# Patient Record
Sex: Female | Born: 1975 | Race: White | Hispanic: No | Marital: Married | Smoking: Never smoker
Health system: Southern US, Community
[De-identification: ages and names within clinical notes are randomized; demographics above are authoritative.]

## PROBLEM LIST (undated history)

## (undated) DIAGNOSIS — G43909 Migraine, unspecified, not intractable, without status migrainosus: Secondary | ICD-10-CM

## (undated) HISTORY — PX: HIP ARTHROSCOPY: SHX668

## (undated) HISTORY — DX: Migraine, unspecified, not intractable, without status migrainosus: G43.909

---

## 2006-08-10 ENCOUNTER — Other Ambulatory Visit: Admission: RE | Admit: 2006-08-10 | Discharge: 2006-08-10 | Payer: Self-pay | Admitting: Obstetrics and Gynecology

## 2007-03-15 ENCOUNTER — Encounter (INDEPENDENT_AMBULATORY_CARE_PROVIDER_SITE_OTHER): Payer: Self-pay | Admitting: Family Medicine

## 2007-03-15 ENCOUNTER — Ambulatory Visit: Payer: Self-pay | Admitting: Family Medicine

## 2007-03-15 LAB — CONVERTED CEMR LAB
Glucose, Bld: 79 mg/dL (ref 70–99)
Progesterone: 13.2 ng/mL
Prolactin: 9 ng/mL
TSH: 3.339 microintl units/mL (ref 0.350–5.50)

## 2007-03-16 ENCOUNTER — Encounter (INDEPENDENT_AMBULATORY_CARE_PROVIDER_SITE_OTHER): Payer: Self-pay | Admitting: Family Medicine

## 2007-09-12 ENCOUNTER — Telehealth: Payer: Self-pay | Admitting: *Deleted

## 2007-10-09 ENCOUNTER — Ambulatory Visit (HOSPITAL_COMMUNITY): Admission: RE | Admit: 2007-10-09 | Discharge: 2007-10-09 | Payer: Self-pay | Admitting: Obstetrics and Gynecology

## 2007-11-22 ENCOUNTER — Inpatient Hospital Stay (HOSPITAL_COMMUNITY): Admission: RE | Admit: 2007-11-22 | Discharge: 2007-11-24 | Payer: Self-pay | Admitting: Obstetrics and Gynecology

## 2008-08-15 ENCOUNTER — Encounter (INDEPENDENT_AMBULATORY_CARE_PROVIDER_SITE_OTHER): Payer: Self-pay | Admitting: Family Medicine

## 2008-08-15 LAB — CONVERTED CEMR LAB: Blood Glucose, AC Bkfst: 80 mg/dL

## 2008-10-15 ENCOUNTER — Encounter (INDEPENDENT_AMBULATORY_CARE_PROVIDER_SITE_OTHER): Payer: Self-pay | Admitting: Family Medicine

## 2008-10-15 LAB — CONVERTED CEMR LAB: Vit D, 1,25-Dihydroxy: 30 (ref 30–89)

## 2009-03-19 ENCOUNTER — Ambulatory Visit (HOSPITAL_COMMUNITY): Admission: RE | Admit: 2009-03-19 | Discharge: 2009-03-19 | Payer: Self-pay | Admitting: Obstetrics and Gynecology

## 2009-04-08 ENCOUNTER — Encounter: Payer: Self-pay | Admitting: *Deleted

## 2009-04-08 DIAGNOSIS — J020 Streptococcal pharyngitis: Secondary | ICD-10-CM

## 2009-05-14 ENCOUNTER — Observation Stay (HOSPITAL_COMMUNITY): Admission: RE | Admit: 2009-05-14 | Discharge: 2009-05-14 | Payer: Self-pay | Admitting: Obstetrics and Gynecology

## 2009-05-27 ENCOUNTER — Inpatient Hospital Stay (HOSPITAL_COMMUNITY): Admission: AD | Admit: 2009-05-27 | Discharge: 2009-05-29 | Payer: Self-pay | Admitting: Obstetrics and Gynecology

## 2010-04-09 ENCOUNTER — Encounter: Admission: RE | Admit: 2010-04-09 | Discharge: 2010-04-09 | Payer: Self-pay | Admitting: Obstetrics and Gynecology

## 2010-10-13 ENCOUNTER — Encounter
Admission: RE | Admit: 2010-10-13 | Discharge: 2010-10-13 | Payer: Self-pay | Source: Home / Self Care | Attending: Family Medicine | Admitting: Family Medicine

## 2010-11-15 ENCOUNTER — Encounter: Payer: Self-pay | Admitting: Obstetrics and Gynecology

## 2010-12-06 ENCOUNTER — Inpatient Hospital Stay (HOSPITAL_COMMUNITY)

## 2010-12-06 ENCOUNTER — Inpatient Hospital Stay (HOSPITAL_COMMUNITY)
Admission: AD | Admit: 2010-12-06 | Discharge: 2010-12-15 | DRG: 781 | Disposition: A | Discharge status: Home / Self Care | Source: Ambulatory Visit | Attending: Obstetrics and Gynecology | Admitting: Obstetrics and Gynecology

## 2010-12-06 DIAGNOSIS — O99891 Other specified diseases and conditions complicating pregnancy: Principal | ICD-10-CM | POA: Diagnosis present

## 2010-12-06 DIAGNOSIS — O212 Late vomiting of pregnancy: Secondary | ICD-10-CM | POA: Diagnosis present

## 2010-12-06 DIAGNOSIS — A0472 Enterocolitis due to Clostridium difficile, not specified as recurrent: Secondary | ICD-10-CM | POA: Diagnosis present

## 2010-12-06 DIAGNOSIS — R1031 Right lower quadrant pain: Secondary | ICD-10-CM | POA: Diagnosis present

## 2010-12-06 LAB — CBC
HCT: 31.4 % — ABNORMAL LOW (ref 36.0–46.0)
Hemoglobin: 10.6 g/dL — ABNORMAL LOW (ref 12.0–15.0)
MCV: 97.2 fL (ref 78.0–100.0)
RDW: 13.4 % (ref 11.5–15.5)
WBC: 8.3 10*3/uL (ref 4.0–10.5)

## 2010-12-06 LAB — COMPREHENSIVE METABOLIC PANEL
ALT: 15 U/L (ref 0–35)
Alkaline Phosphatase: 94 U/L (ref 39–117)
BUN: 4 mg/dL — ABNORMAL LOW (ref 6–23)
CO2: 22 mEq/L (ref 19–32)
GFR calc non Af Amer: >60 mL/min (ref >60)
Glucose, Bld: 99 mg/dL (ref 70–99)
Potassium: 3.5 mEq/L (ref 3.5–5.1)
Sodium: 131 mEq/L — ABNORMAL LOW (ref 135–145)
Total Bilirubin: 0.5 mg/dL (ref 0.3–1.2)
Total Protein: 5.5 g/dL — ABNORMAL LOW (ref 6.0–8.3)

## 2010-12-06 LAB — URINALYSIS, ROUTINE W REFLEX MICROSCOPIC
Bilirubin Urine: NEGATIVE
Ketones, ur: NEGATIVE mg/dL
Nitrite: NEGATIVE
Protein, ur: NEGATIVE mg/dL

## 2010-12-06 LAB — AMYLASE: Amylase: 50 U/L (ref 0–105)

## 2010-12-06 LAB — DIFFERENTIAL
Basophils Absolute: 0 10*3/uL (ref 0.0–0.1)
Eosinophils Relative: 1 % (ref 0–5)
Lymphocytes Relative: 14 % (ref 12–46)
Lymphs Abs: 1.1 10*3/uL (ref 0.7–4.0)
Monocytes Absolute: 0.6 10*3/uL (ref 0.1–1.0)
Neutro Abs: 6.6 10*3/uL (ref 1.7–7.7)

## 2010-12-06 LAB — URINE MICROSCOPIC-ADD ON

## 2010-12-06 LAB — FETAL FIBRONECTIN: Fetal Fibronectin: NEGATIVE

## 2010-12-06 MED ORDER — IOHEXOL 300 MG/ML  SOLN
100.0000 mL | Freq: Once | INTRAMUSCULAR | Status: AC | PRN
  Administered 2010-12-06: 100 mL via INTRAVENOUS

## 2010-12-07 LAB — CBC
MCH: 32.8 pg (ref 26.0–34.0)
MCHC: 33.3 g/dL (ref 30.0–36.0)
MCV: 98.3 fL (ref 78.0–100.0)
Platelets: 168 10*3/uL (ref 150–400)
RDW: 13.6 % (ref 11.5–15.5)

## 2010-12-08 LAB — CBC
HCT: 27.9 % — ABNORMAL LOW (ref 36.0–46.0)
MCHC: 33 g/dL (ref 30.0–36.0)
MCV: 99.3 fL (ref 78.0–100.0)
RDW: 13.6 % (ref 11.5–15.5)

## 2010-12-08 LAB — URINE CULTURE: Colony Count: NO GROWTH

## 2010-12-09 LAB — CBC
HCT: 26.9 % — ABNORMAL LOW (ref 36.0–46.0)
MCH: 33.2 pg (ref 26.0–34.0)
MCV: 99.3 fL (ref 78.0–100.0)
Platelets: 174 10*3/uL (ref 150–400)
RBC: 2.71 MIL/uL — ABNORMAL LOW (ref 3.87–5.11)
RDW: 13.6 % (ref 11.5–15.5)

## 2010-12-10 NOTE — Consult Note (Addendum)
Alicia [name], Alicia [name]              ACCOUNT NO.:  [id number]  MEDICAL RECORD NO.:  [id number]           PATIENT TYPE:  I  LOCATION:  9151                          [facility]ACILITY:  [facility]  PHYSICIAN:  Lennie Muckle, MD      DATE OF BIRTH:  Jul 06, 1976  DATE OF CONSULTATION:  12/06/2010 DATE OF DISCHARGE:                                CONSULTATION   CHIEF COMPLAINT:  Abdominal pain.  REFERRING PHYSICIAN:  Sherron Monday, MD  REASON FOR CONSULTATION:  Rule out appendicitis.  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  She states that she had diarrhea on Wednesday.  She has been around several sick people, doing her job as a Development worker, community.  She has crampy abdominal pain.  The diarrhea had resolved by Thursday.  However, she began having abdominal pain today which was located in the epigastric region.  She states that it then seemed to be more localized towards the right lower quadrant.  She also had noticed some contractions.  She has not had any fevers or chills.  She describes no nausea or vomiting.  She has some mild nausea but no emesis.  She has had a CBC drawn with a white count of 8.3.  Her serum chemistries were all within normal limits.  She received an ultrasound in the emergency department at the [facility] which was negative.  She did have one for imaging which revealed thickening around the cecum, ascending and a splenic flexure.  There was concern for questionable appendicitis on examination.  They could not definitely see the appendix on imaging.  PAST MEDICAL HISTORY:  Carcinoma in situ.  She has had a vasovagal syndrome.  SURGICAL HISTORY:  Excision of a melanoma.  MEDICATIONS:  Prenatal vitamin.  No drug allergies.  SOCIAL HISTORY:  She is married.  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 No tobacco or alcohol use.  FAMILY HISTORY:  Diabetes, hypertension, and strokes.  REVIEW OF SYSTEMS:  As per the  HPI.  PHYSICAL EXAMINATION:  GENERAL:  Pleasant obviously pregnant female, lying in stretcher, in no acute distress. VITAL SIGNS:  Temperature is 98.1, blood pressure is 110/61, pulse is 70. ABDOMEN:  Focus examination of the abdomen, gravid abdomen.  She is soft to palpation.  There is some tenderness with palpation on the right side of the abdomen.  No frank peritoneal signs.  No rebound tenderness.  The uterus is palpable.  CT scan image reviewed with Radiology.  I do see a thickening of the hepatic flexure as extending in to the cecum as well as the splenic flexure and descending colon.  I no really see any significant amount of fluid.  ASSESSMENT AND PLAN:  Enterocolitis with likely viral in etiology.  We talked with Dr. Ellyn Hack about the findings of the CT scan.  Given the fact that she has had some contractions and there is a concern due to the inflammation, I agree with IV antibiotics.  We will place her on Zosyn, likely send her home on Augmentin.  She will be admitted for 24- hour  observation with repeating examination.  I do not think any further imaging or intervention is required.  Definitely there is no need for surgery as there are no findings consistent with acute appendicitis.     Lennie Muckle, MD     ALA/MEDQ  D:  12/06/2010  T:  12/07/2010  Job:  [id number]  Electronically Signed by Bertram Savin MD on 12/10/2010 02:20:02 PM

## 2010-12-11 LAB — CBC
HCT: 28.7 % — ABNORMAL LOW (ref 36.0–46.0)
MCH: 33 pg (ref 26.0–34.0)
MCHC: 33.8 g/dL (ref 30.0–36.0)
RDW: 13.1 % (ref 11.5–15.5)

## 2010-12-11 LAB — COMPREHENSIVE METABOLIC PANEL
AST: 17 U/L (ref 0–37)
Albumin: 1.6 g/dL — ABNORMAL LOW (ref 3.5–5.2)
Alkaline Phosphatase: 98 U/L (ref 39–117)
Glucose, Bld: 75 mg/dL (ref 70–99)
Sodium: 136 mEq/L (ref 135–145)
Total Bilirubin: 0.4 mg/dL (ref 0.3–1.2)

## 2010-12-16 DIAGNOSIS — A0472 Enterocolitis due to Clostridium difficile, not specified as recurrent: Secondary | ICD-10-CM

## 2010-12-22 NOTE — Discharge Summary (Signed)
  NAMEVERMELL, [name]              ACCOUNT NO.:  [id number]  MEDICAL RECORD NO.:  [id number]           PATIENT TYPE:  I  LOCATION:  9151                          [facility]:  [facility]  PHYSICIAN:  Huel Cote, M.D. DATE OF BIRTH:  [birth year]  DATE OF ADMISSION:  12/06/2010 DATE OF DISCHARGE:  12/15/2010                              DISCHARGE SUMMARY   DISCHARGE DIAGNOSES: 1. 33 and 6/7th weeks gestation. 2. Clostridium difficile colitis.  DISCHARGE MEDICATIONS: 1. Flagyl 500 mg p.o. t.i.d. for 10 days. 2. Vancomycin 125 mg p.o. q.i.d. for 10 days. 3. Phenergan 25 mg p.o. every 6 hours p.r.n.  DISCHARGE FOLLOWUP:  The patient is to follow up in the office in 1 week.  HOSPITAL COURSE:  The patient is a 35 year old G3, P2-0-0-2 who came in at 40 plus weeks' gestation with nausea, vomiting, diarrhea, and significant abdominal pain, which was concerning for peritoneal signs. She was evaluated initially by General Surgery for ruling out appendicitis and had a CT scan, which revealed a colitis-type picture. She then had stool cultures performed on the second day of admission, which confirmed C. diff and infection.  She was placed on Flagyl 500 mg p.o. three times daily and made very slow improvement.  She also received vancomycin enemas x2 per Surgery's recommendation; however, despite this really did not improve as expected.  Infectious Disease was consulted and Dr. Maurice March suggested adding vancomycin 125 mg p.o. q.i.d., this was done on December 11, 2010.  Over the next several days, the patient continued to make gradual improvement.  She finally after approximately 4 days of vancomycin was able to tolerate p.o. intake well.  Her pain was significantly improved and she had a minimal amount of stools with only approximately 2 loose stools a day.  At this point, it was discussed with Dr. Maurice March who felt that she could safely be managed as an outpatient and she was discharged to home to  continue therapy for an additional 10 days on both her p.o. vancomycin and p.o. Flagyl.  She was given a prescription for Phenergan p.r.n. and was advised to follow up in the office in 1 week for continuation of her routine obstetrical care.     Huel Cote, M.D.     KR/MEDQ  D:  12/15/2010  T:  12/15/2010  Job:  [id number]  Electronically Signed by Huel Cote M.D. on 12/22/2010 08:47:14 AM

## 2010-12-23 ENCOUNTER — Other Ambulatory Visit (HOSPITAL_COMMUNITY): Payer: Self-pay | Admitting: Obstetrics and Gynecology

## 2010-12-29 ENCOUNTER — Ambulatory Visit (HOSPITAL_COMMUNITY)
Admission: RE | Admit: 2010-12-29 | Discharge: 2010-12-29 | Disposition: A | Discharge status: Home / Self Care | Source: Ambulatory Visit | Attending: Obstetrics and Gynecology | Admitting: Obstetrics and Gynecology

## 2010-12-29 DIAGNOSIS — Z3689 Encounter for other specified antenatal screening: Secondary | ICD-10-CM | POA: Insufficient documentation

## 2010-12-29 DIAGNOSIS — O365990 Maternal care for other known or suspected poor fetal growth, unspecified trimester, not applicable or unspecified: Secondary | ICD-10-CM | POA: Insufficient documentation

## 2011-01-20 ENCOUNTER — Inpatient Hospital Stay (HOSPITAL_COMMUNITY)
Admission: AD | Admit: 2011-01-20 | Discharge: 2011-01-22 | DRG: 775 | Disposition: A | Discharge status: Home / Self Care | Source: Ambulatory Visit | Attending: Obstetrics and Gynecology | Admitting: Obstetrics and Gynecology

## 2011-01-20 LAB — CBC
MCH: 32.2 pg (ref 26.0–34.0)
MCHC: 33.3 g/dL (ref 30.0–36.0)
Platelets: 197 10*3/uL (ref 150–400)

## 2011-01-21 LAB — CBC
MCHC: 32.3 g/dL (ref 30.0–36.0)
Platelets: 168 10*3/uL (ref 150–400)
RDW: 13 % (ref 11.5–15.5)

## 2011-01-21 LAB — RPR: RPR Ser Ql: NONREACTIVE

## 2011-01-26 NOTE — Discharge Summary (Signed)
Alicia [name], FINES              ACCOUNT NO.:  [id number]  MEDICAL RECORD NO.:  [id number]           PATIENT TYPE:  I  LOCATION:  9148                          [facility]  PHYSICIAN:  Huel Cote, M.D. DATE OF BIRTH:  1976-10-02  DATE OF ADMISSION:  01/20/2011 DATE OF DISCHARGE:  01/22/2011                              DISCHARGE SUMMARY   DISCHARGE DIAGNOSES: 1. Term pregnancy at 38-5/7 weeks, delivered. 2. Status post normal spontaneous vaginal delivery.  DISCHARGE MEDICATIONS:  Motrin 600 mg p.o. every 6 hours.  DISCHARGE FOLLOWUP:  The patient is to follow up in the office in 6 weeks for her full postpartum exam.  HOSPITAL COURSE:  The patient is a 35 year old G3, P 2-0-0-2, who was admitted at 38-5/7 weeks' gestation in active labor with contractions every 2-3 minutes.  Cervix was 5+ centimeters on admission.  She was brought to Labor and Delivery and received her epidural.  Prenatal care had been complicated by a course of C. diff colitis requiring a 9-day hospital stay and IV vancomycin and Flagyl.  She also had size less than dates, but ultrasound showed normal growth.  PRENATAL LABS:  Are as follows:  O positive.  Antibody negative. Rubella immune.  Hepatitis B surface antigen negative.  HIV negative. RPR nonreactive.  GC negative.  Chlamydia negative.  CF negative.  First trimester screen negative.  Alpha-fetoprotein negative.  One-hour Glucola 130.  Group B strep negative.  PAST OBSTETRICAL HISTORY:  In 2009, she had a vaginal delivery of a 5- pound 6-ounce infant.  In 2010, she had a vaginal delivery of a 6-pound 5-ounce infant.  PAST GYN HISTORY:  ASCUS with HPV positive Pap.  Occult blood was negative.  She is to repeat at postpartum.  PAST SURGICAL HISTORY:  Melanoma excision.  PAST MEDICAL HISTORY:  Melanoma, C. diff colitis, and vasovagal symptoms.  ALLERGIES:  SHE HAD NO KNOWN DRUG ALLERGIES.  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  She has no tobacco, alcohol or drug use.   On admission, blood pressure was normal at 128/76.  Fetal heart rate reactive.  After receiving her epidural, cervix was found to be 9 cm dilated.  She had rupture of membranes with clear fluid noted and progressed rapidly and reached complete dilation.  She pushed great with normal spontaneous vaginal delivery of viable female infant over a small first-degree laceration.  Apgars were 9 and 9, weight was 6 pounds 8 ounces.  Placenta delivery was spontaneous.  Estimated blood loss was 350 mL.  She had a first-degree laceration repaired with 3-0 Vicryl Rapide for hemostasis.  Small periurethral inclusion cyst was drained of milky fluid measuring approximately 1.5 cm.  She was admitted for routine postpartum care.  On postpartum day #1, hemoglobin was stable at 9.4.  On postpartum day #2, she was felt stable for discharge home with given instruction on pelvic rest and will follow up in the office in 6 weeks for her full postpartum exam.     Huel Cote, M.D.     KR/MEDQ  D:  01/22/2011  T:  01/22/2011  Job:  [id number]  Electronically Signed by Huel Cote M.D. on 01/26/2011 09:29:58 AM

## 2011-01-29 ENCOUNTER — Inpatient Hospital Stay (HOSPITAL_COMMUNITY): Admission: AD | Admit: 2011-01-29 | Payer: Self-pay | Source: Home / Self Care | Admitting: Obstetrics and Gynecology

## 2011-01-30 LAB — CBC
HCT: 28.2 % — ABNORMAL LOW (ref 36.0–46.0)
Hemoglobin: 9.8 g/dL — ABNORMAL LOW (ref 12.0–15.0)
Platelets: 115 10*3/uL — ABNORMAL LOW (ref 150–400)
RBC: 2.84 MIL/uL — ABNORMAL LOW (ref 3.87–5.11)
RBC: 3.52 MIL/uL — ABNORMAL LOW (ref 3.87–5.11)
WBC: 6.4 10*3/uL (ref 4.0–10.5)
WBC: 8 10*3/uL (ref 4.0–10.5)

## 2011-01-30 LAB — RPR: RPR Ser Ql: NONREACTIVE

## 2011-03-08 ENCOUNTER — Other Ambulatory Visit: Payer: Self-pay | Admitting: Family Medicine

## 2011-03-08 ENCOUNTER — Other Ambulatory Visit: Payer: Self-pay | Admitting: Obstetrics and Gynecology

## 2011-03-09 NOTE — Discharge Summary (Signed)
NAMESHAINNA, FAUX              ACCOUNT NO.:  [id number]   MEDICAL RECORD NO.:  [id number]          PATIENT TYPE:  INP   LOCATION:  9114                          [facility]  [facility]   PHYSICIAN:  Sherron Monday, MD        DATE OF BIRTH:  [birth year]   DATE OF ADMISSION:  05/27/2009  DATE OF DISCHARGE:  05/29/2009                               DISCHARGE SUMMARY   ADMITTING DIAGNOSIS:  Intrauterine pregnancy at term in active labor.   DISCHARGE DIAGNOSIS:  Intrauterine pregnancy at term in active labor,  delivered via spontaneous vaginal delivery.   HISTORY OF PRESENT ILLNESS:  A 35 year old, G2, P1-0-0-1 at 39 plus 4 in  active labor.  She states that she has had good fetal movement, no loss  of fluid, no vaginal bleeding, contractions increasing in intensity  since approximately 5 p.m.  The pregnancy was complicated by breech  presentation, version was performed at approximately 37 weeks, that was  successful, otherwise uncomplicated care.   PAST MEDICAL HISTORY:  Significant for vasovagal syndrome as well as a  recent diagnosis of a in situ melanoma.   PAST SURGICAL HISTORY:  Significant for skin incision, excision of the  melanoma.   PAST OB/GYN HISTORY:  Concepcion Living was in January 2009, vaginal delivery of a female  infant.  The pregnancy was complicated by IUGR of the infant.  G2 is the  present pregnancy.  Initially, it was a twin pregnancy, but a vanishing  twin.  The patient states she has had abnormal Pap smear and had a  colposcopy with a repeat normal.  No history of any infection or  sexually transmitted diseases.   MEDICATIONS:  Prenatal vitamins.   ALLERGIES:  No known drug allergies.   SOCIAL HISTORY:  Denies alcohol, tobacco, or drug use.  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   FAMILY HISTORY:  Diabetes in maternal grandparents, hypertension in  father; passed away from a stroke.   PRENATAL LABS:  Hemoglobin 12.7,  platelets 147,000, O positive, antibody  screen negative.  Gonorrhea negative, chlamydia negative, RPR  nonreactive, rubella immune, cystic fibrosis screen negative, hepatitis  B surface antigen negative, HIV negative, first trimester screen within  normal limits, AFP within normal limits, Glucola 74, and group B strep  was negative.  Ultrasound performed at 19 weeks showed normal anatomy  except limited heart anterior/right placenta in the female infant at 57-  week scan completed, normal anatomy at 35 weeks' scan revealed normal  AFI growth of 33% on breech presentation.  Following this, a reversion  was performed.   On admission physical exam, afebrile.  Vital signs stable with benign  exam.  Fetal heart tones in the 130s with contractions every 2-3  minutes, 7-8 cm dilated, 9% effaced, and 0 station.  She received an  epidural and AROM was performed for clear fluid.  She rapidly progressed  to complete and she pushed for approximately 5 minutes to delivery of a  viable female infant at 12:02 a.m. with Apgars of 8 at 1 minute  and 9 at  5 minutes, weight of 6 pounds 5 ounces.  Placenta was expressed intact  at 07, a second-degree perineal laceration repaired with 3-0 Vicryl  Rapide in the typical fashion.  EBL was less than 500 mL.  Her  postpartum course was relatively uncomplicated.  She remained afebrile  with vital signs stable.  Normal lochia and pain was well controlled.  She was ambulating and voiding without difficulty.  She was discharged  to home on postpartum day #1 at her request.  She was discharged home  with prescriptions for Motrin and prenatal vitamins.  She will follow up  in approximately 6 weeks.  She is O positive, rubella immune.  She plans  to breast feed.  Her hemoglobin decreased from 12.0-9.8 and she declines  contraception at this time.      Sherron Monday, MD  Electronically Signed     JB/MEDQ  D:  05/29/2009  T:  05/29/2009  Job:  [id number]

## 2011-03-12 NOTE — Discharge Summary (Signed)
Alicia [name], SLUTSKY              ACCOUNT NO.:  [id number]   MEDICAL RECORD NO.:  [id number]          PATIENT TYPE:  INP   LOCATION:  9114                          [facility]   PHYSICIAN:  Huel Cote, M.D. DATE OF BIRTH:  27-May-1976   DATE OF ADMISSION:  11/22/2007  DATE OF DISCHARGE:  11/24/2007                               DISCHARGE SUMMARY   DISCHARGE DIAGNOSES:  1. Term pregnancy at 30+ weeks delivered  2. Intrauterine growth retardation with estimated fetal weight less      than 5th percentile.   DISCHARGE MEDICATIONS:  Motrin 600 mg p.o. every 6 hours.   DISCHARGE FOLLOWUP:  The patient is to follow up in 6 weeks for her  postpartum exam.   HOSPITAL COURSE:  The patient's the 35 year old G1, P0 who was admitted  in active labor.  Prenatal care had been complicated by a lagging fundal  height, with serial growth scans performed which were consistently in  the 20th to 25th percentile, except for the one performed the day prior  to delivery which showed an EFW of less than 5th percentile.  The  patient had an onset of contractions, though she had been planning to  come in for induction anyway.  When she reached labor and delivery, she  was found to be 7 to 8 cm dilated.   PAST MEDICAL HISTORY:  None.   PAST SURGICAL HISTORY:  None.   PHYSICAL EXAMINATION:  Vital signs on admission, she was afebrile with  stable vital signs.  Fetal heart rate was reactive.  She had a rupture  of membranes performed when she was 7 to 8 cm, clear fluid noted, and  received an epidural.  She then reached complete dilation,  pushed for  about 30 minutes with a normal spontaneous vaginal delivery of a  vigorous female infant over a first-degree laceration.  Apgars were 8 and  9, weight was 5 pounds, 6 ounces.  There was a nuchal cord x1, delivered  through.  Placenta was delivered spontaneously.  First-degree laceration  was repaired with 3-0 Vicryl.  Cervix and rectum were intact.   The baby  did quite well but was small in size.  Postpartum day #2, the patient  was doing well.  Her pain was well controlled.  Hemoglobin was 10.5  postpartum, and she was discharged home with medications and followup as  previously stated.      Huel Cote, M.D.  Electronically Signed     KR/MEDQ  D:  12/04/2007  T:  12/06/2007  Job:  [id number]

## 2011-04-16 ENCOUNTER — Ambulatory Visit
Admission: RE | Admit: 2011-04-16 | Discharge: 2011-04-16 | Disposition: A | Discharge status: Home / Self Care | Source: Ambulatory Visit | Attending: Obstetrics and Gynecology | Admitting: Obstetrics and Gynecology

## 2011-07-15 LAB — CBC
HCT: 36.9
Hemoglobin: 12.8
MCHC: 34.7
MCHC: 35.3
MCV: 99.9
RBC: 2.98 — ABNORMAL LOW
RDW: 13.1

## 2011-12-15 ENCOUNTER — Other Ambulatory Visit: Payer: Self-pay | Admitting: Obstetrics and Gynecology

## 2011-12-15 DIAGNOSIS — N63 Unspecified lump in unspecified breast: Secondary | ICD-10-CM

## 2011-12-24 ENCOUNTER — Ambulatory Visit
Admission: RE | Admit: 2011-12-24 | Discharge: 2011-12-24 | Disposition: A | Discharge status: Home / Self Care | Source: Ambulatory Visit | Attending: Obstetrics and Gynecology | Admitting: Obstetrics and Gynecology

## 2011-12-24 DIAGNOSIS — N63 Unspecified lump in unspecified breast: Secondary | ICD-10-CM

## 2012-08-03 ENCOUNTER — Other Ambulatory Visit: Payer: Self-pay | Admitting: Obstetrics and Gynecology

## 2012-08-03 DIAGNOSIS — N63 Unspecified lump in unspecified breast: Secondary | ICD-10-CM

## 2012-08-09 ENCOUNTER — Ambulatory Visit
Admission: RE | Admit: 2012-08-09 | Discharge: 2012-08-09 | Disposition: A | Discharge status: Home / Self Care | Source: Ambulatory Visit | Attending: Obstetrics and Gynecology | Admitting: Obstetrics and Gynecology

## 2012-08-09 DIAGNOSIS — N63 Unspecified lump in unspecified breast: Secondary | ICD-10-CM

## 2014-05-13 ENCOUNTER — Other Ambulatory Visit: Payer: Self-pay | Admitting: Orthopaedic Surgery

## 2014-05-13 DIAGNOSIS — M25552 Pain in left hip: Secondary | ICD-10-CM

## 2014-05-23 ENCOUNTER — Ambulatory Visit
Admission: RE | Admit: 2014-05-23 | Discharge: 2014-05-23 | Disposition: A | Discharge status: Home / Self Care | Source: Ambulatory Visit | Attending: Orthopaedic Surgery | Admitting: Orthopaedic Surgery

## 2014-05-23 DIAGNOSIS — M25552 Pain in left hip: Secondary | ICD-10-CM

## 2014-05-23 MED ORDER — IOHEXOL 180 MG/ML  SOLN
15.0000 mL | Freq: Once | INTRAMUSCULAR | Status: AC | PRN
  Administered 2014-05-23: 15 mL via INTRA_ARTICULAR

## 2014-08-22 ENCOUNTER — Ambulatory Visit (INDEPENDENT_AMBULATORY_CARE_PROVIDER_SITE_OTHER): Admitting: Sports Medicine

## 2014-08-22 ENCOUNTER — Encounter: Payer: Self-pay | Admitting: Sports Medicine

## 2014-08-22 VITALS — BP 112/76 | Ht 72.0 in | Wt 150.0 lb

## 2014-08-22 DIAGNOSIS — G8929 Other chronic pain: Secondary | ICD-10-CM

## 2014-08-22 DIAGNOSIS — M545 Low back pain, unspecified: Secondary | ICD-10-CM

## 2014-08-22 DIAGNOSIS — M25552 Pain in left hip: Secondary | ICD-10-CM

## 2014-08-22 DIAGNOSIS — M25559 Pain in unspecified hip: Secondary | ICD-10-CM

## 2014-08-22 NOTE — Assessment & Plan Note (Signed)
Since she did improve with corticosteroid injection I do believe she could try conservative care for a period of time I would recommend she lessen the amount of time she does weightbearing exercise Start some recumbent or regular biking  --.avoid hip flexion to the degree that causes pain She can do short cycle rowing with limited hip flexion  Use Celebrex as needed  Try this for the next 2-3 months and then we will reevaluate this

## 2014-08-22 NOTE — Assessment & Plan Note (Signed)
On examination this is associated with mild thoracolumbar scoliosis  I suspect this is led to some muscle imbalance  I gave her a series of back exercises to do her home program along with some core exercises  I think she should try those for the next 2-3 months and we'll see how she responds

## 2014-08-22 NOTE — Progress Notes (Signed)
Patient ID: Alicia [name], female   DOB: 1976-07-14, 38 y.o.   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 Magnus IvanBlackman and has also seen Dr. 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 Caswell CorwinStubbs would like to do surgery but she feels that she got a fair amount of improvement with corticosteroid injection The surgery would require her to be on crutches and to be careful with lifting for a significant period of time She has 3 young children and is hesitant to do the surgery because of the difficulty in limiting her activity as much  She does continue to get pain with hip flexion and rotation that sounds consistent with a labral tear However, unless she does sports activities the pain is manageable and she does very well with periodic oral Celebrex  She is concerned about the long-term implications regarding hip replacement  Problem #2 is low back pain She has a history of low back pain that was significant even in college They utilized a lot of physical therapy modalities to keep her active in rowing No history of sciatica or disc injury The back pain  flares intermittently without specific causes but can be brought on by standing too long or by doing activities that she has not done recently No red flags or weakness  Physical examination Tall thin female in no acute distress and she looks muscular BP 112/76  Ht 6' (1.829 m)  Wt 150 lb (68.04 kg)  BMI 20.34 kg/m2  Hip range of motion on rotation seated on the right is 100 and is similar in a supine 90/90 position On the left when seated this is about 75 and similar when supine Hip flexion causes pain on the left FADIR or FABER causes pain on left  SI joints move well No muscle spasm detected in the lumbar region There is a compensated curve starting in the low thoracic region with compensation in  the lumbar region probably of about 10-15 Back extension flexion and rotation are normal Straight leg raise is normal  Strength is good

## 2014-10-08 ENCOUNTER — Encounter: Payer: Self-pay | Admitting: Sports Medicine

## 2014-10-08 ENCOUNTER — Ambulatory Visit (INDEPENDENT_AMBULATORY_CARE_PROVIDER_SITE_OTHER): Admitting: Sports Medicine

## 2014-10-08 VITALS — BP 109/73 | HR 66 | Ht 72.0 in | Wt 150.0 lb

## 2014-10-08 DIAGNOSIS — M25552 Pain in left hip: Secondary | ICD-10-CM

## 2014-10-08 DIAGNOSIS — G8929 Other chronic pain: Secondary | ICD-10-CM

## 2014-10-08 NOTE — Progress Notes (Signed)
Patient ID: Alicia [name], female   DOB: Oct 04, 1976, 38 y.o.   MRN: [id number]  Subjective: Patient presents today for follow-up of her left hip pain. 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 She had an intra-articular injection back in August which eliminated her pain for 6 weeks. Her hip pain has gradually worsened since that time. Since our last visit she has been doing recumbent bike which she tolerates well. She does occasionally have some increase in her hip pain after biking but no pain during biking. Her hip is no longer catching and certain movements don't seem to bother her anymore. However, she does have a new achy pain in her left groin. This achy pain improved she took a week off from biking over Thanksgiving. She is trying to avoid use of Celebrex but is using 1-2 times a week which seems to help. Pain from her left hip radiates slightly into her proximal anterior thigh. No numbness or tingling. The back exercises are helping her low back pain. They seem to loosen her up for several hours. She is pleased with progress of her back. She is concerned that putting off surgery on her hip may increase her risk for degenerative pathology ultimately requiring hip replacement.  Consultants:  Sr Caswell CorwinStubbs   Objective: BP 109/73 mmHg  Pulse 66  Ht 6' (1.829 m)  Wt 150 lb (68.04 kg)  BMI 20.34 kg/m2 Her left hip is nontender to palpation Range of motion in her left hip has improved 15 compared to previous visit Internal and external rotation of her left hip and deep thigh flexion in a seated position reproduce her achy anterior groin pain She has pain with FABER and FADIR Seated SLR negative SI joints move well Mild paraspinal tenderness to palpation in the lumbar region Back extension flexion and rotation are normal Straight leg raise is normal  Assessment/Plan: 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  1.  Left  Hip Labral Tear 2.  Left Hip Pain  Overall she has improved significantly in both in both hip pain and range of motion.  She will continue to use recumbent bike and can row at home while avoiding deep flexion.  Discussed warning symptoms like night pain and gait changes including a limp that would be signs of worsening hip pathology.  Suspect her left groin pain is secondary to capsule irritation from increased translation within the hip joint secondary to her labral tear.  Continue Celebrex to help with inflammation and pain.   Follow-up in 3 months.  Patient seen and discussed with Dr. Darrick PennaFields.  Mickle PlumbEvan Lutz PGY-3 Family Medicine  I agree with this assessment and plan.  Motion is improved and strength is excellent. I would give this more time as revision options and recovery time are a real challenge for her with 3 young children. Sterling BigKB Alicia Mangan, MD

## 2014-10-08 NOTE — Assessment & Plan Note (Signed)
I think she has made progress.  I would favor conservative care for next 3 mos and then reassess to see if improvement in ROM and in sxs.  We can reinject with CSI if worsening.

## 2015-07-25 ENCOUNTER — Other Ambulatory Visit: Payer: Self-pay | Admitting: Obstetrics and Gynecology

## 2015-07-25 DIAGNOSIS — N6320 Unspecified lump in the left breast, unspecified quadrant: Secondary | ICD-10-CM

## 2015-07-31 ENCOUNTER — Ambulatory Visit
Admission: RE | Admit: 2015-07-31 | Discharge: 2015-07-31 | Disposition: A | Discharge status: Home / Self Care | Source: Ambulatory Visit | Attending: Obstetrics and Gynecology | Admitting: Obstetrics and Gynecology

## 2015-07-31 DIAGNOSIS — N6320 Unspecified lump in the left breast, unspecified quadrant: Secondary | ICD-10-CM

## 2015-11-10 ENCOUNTER — Telehealth (HOSPITAL_COMMUNITY): Payer: Self-pay | Admitting: *Deleted

## 2015-11-11 ENCOUNTER — Other Ambulatory Visit (HOSPITAL_COMMUNITY): Payer: Self-pay | Admitting: Family Medicine

## 2015-11-11 DIAGNOSIS — Z8249 Family history of ischemic heart disease and other diseases of the circulatory system: Secondary | ICD-10-CM

## 2015-11-25 ENCOUNTER — Other Ambulatory Visit: Payer: Self-pay

## 2015-11-25 ENCOUNTER — Ambulatory Visit (HOSPITAL_COMMUNITY): Attending: Cardiology

## 2015-11-25 DIAGNOSIS — Z8249 Family history of ischemic heart disease and other diseases of the circulatory system: Secondary | ICD-10-CM | POA: Diagnosis not present

## 2015-11-25 DIAGNOSIS — I340 Nonrheumatic mitral (valve) insufficiency: Secondary | ICD-10-CM | POA: Insufficient documentation

## 2016-06-29 ENCOUNTER — Other Ambulatory Visit: Payer: Self-pay | Admitting: Obstetrics and Gynecology

## 2016-06-29 DIAGNOSIS — Z1231 Encounter for screening mammogram for malignant neoplasm of breast: Secondary | ICD-10-CM

## 2016-08-02 ENCOUNTER — Ambulatory Visit

## 2016-08-23 ENCOUNTER — Ambulatory Visit
Admission: RE | Admit: 2016-08-23 | Discharge: 2016-08-23 | Disposition: A | Discharge status: Home / Self Care | Source: Ambulatory Visit | Attending: Obstetrics and Gynecology | Admitting: Obstetrics and Gynecology

## 2016-08-23 DIAGNOSIS — Z1231 Encounter for screening mammogram for malignant neoplasm of breast: Secondary | ICD-10-CM

## 2017-11-28 ENCOUNTER — Encounter (INDEPENDENT_AMBULATORY_CARE_PROVIDER_SITE_OTHER): Payer: Self-pay | Admitting: Physician Assistant

## 2017-11-28 ENCOUNTER — Other Ambulatory Visit: Payer: Self-pay | Admitting: Family Medicine

## 2017-11-28 ENCOUNTER — Ambulatory Visit
Admission: RE | Admit: 2017-11-28 | Discharge: 2017-11-28 | Disposition: A | Discharge status: Home / Self Care | Source: Ambulatory Visit | Attending: Family Medicine | Admitting: Family Medicine

## 2017-11-28 ENCOUNTER — Ambulatory Visit (INDEPENDENT_AMBULATORY_CARE_PROVIDER_SITE_OTHER): Admitting: Physician Assistant

## 2017-11-28 ENCOUNTER — Other Ambulatory Visit (INDEPENDENT_AMBULATORY_CARE_PROVIDER_SITE_OTHER): Payer: Self-pay | Admitting: *Deleted

## 2017-11-28 DIAGNOSIS — M79671 Pain in right foot: Secondary | ICD-10-CM | POA: Diagnosis not present

## 2017-11-28 DIAGNOSIS — S99921A Unspecified injury of right foot, initial encounter: Secondary | ICD-10-CM

## 2017-11-28 NOTE — Progress Notes (Signed)
Office Visit Note   Patient: Alicia PinaKimberlee D Parodi, MD           Date of Birth: 21-Sep-1976           MRN: [id number] Visit Date: 11/28/2017              Requested by: Shirlean MylarWebb, Carol, MD [address] [place], [place] [address] PCP: Shirlean MylarWebb, Carol, MD   Assessment & Plan: Visit Diagnoses:  1. Pain in right foot     Plan: She is placed in a postop shoe.  She will wear this for at least the next 2 weeks and then wean into regular shoes as tolerated.  She will buddy tape the fourth and fifth tenderness.  Taping performed for her today.  She will follow-up if pain persist or becomes worse.  Discussed with her the need to take 4-6 weeks for her to get back into a regular shoe.  Follow-Up Instructions: Return if symptoms worsen or fail to improve.   Orders:  No orders of the defined types were placed in this encounter.  No orders of the defined types were placed in this encounter.     Procedures: No procedures performed   Clinical Data: No additional findings.   Subjective: Right foot pain  HPI Dr. Clelia CroftShaw is a 42 year old female comes in today due to right foot pain.  She reports that last night before going to bed she accidentally stubbed her toe against her husband's foot.  She saw her primary care physician today Dr. Hyman HopesWebb and [facility] .  Radiographs of her foot showed a mildly displaced fifth proximal phalanx fracture.  No other fractures identified throughout the foot.  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  No other bony abnormalities.  Review of Systems Please see HPI otherwise negative  Objective: Vital Signs: LMP 11/17/2017   Physical Exam  Constitutional: She is oriented to person, place, and time. She appears well-developed and well-nourished. No distress.  Cardiovascular: Intact distal pulses.  Pulmonary/Chest: Effort normal.  Neurological: She is alert and oriented to person, place, and time.   Skin: She is not diaphoretic.  Psychiatric: She has a normal mood and affect. Her behavior is normal.    Ortho Exam Right foot no gross deformity she has significant ecchymosis over the dorsal and lateral aspect of the fifth meta tarsal distally and the fifth toe.  She has tenderness over the fifth metatarsal and fifth toe region.  Remainder the foot is nontender.  She is nontender over the posterior tibial tendon and peroneal tendons. Specialty Comments:  No specialty comments available.  Imaging: Dg Foot Complete Right  Result Date: 11/28/2017 CLINICAL DATA:  Right foot pain centered over the fifth ray after kicking her husband's foot last night. No history of previous injury. EXAM: RIGHT FOOT COMPLETE - 3+ VIEW COMPARISON:  None in PACs FINDINGS: There is an acute minimally displaced spiral fracture of the shaft of the proximal phalanx of the fifth toe. The middle and distal phalanges are intact. The fifth metatarsal is intact. No fracture of the other phalanges or metatarsals is seen. The bones of the hindfoot are unremarkable. IMPRESSION: There is an acute minimally displaced spiral fracture of the shaft of the proximal phalanx of the right fifth toe. There is mild overlying soft tissue swelling. Electronically Signed   By: David  SwazilandJordan M.D.   On: 11/28/2017 10:47     PMFS History: Patient Active Problem  List   Diagnosis Date Noted  . Chronic hip pain 08/22/2014  . Low back pain, episodic 08/22/2014  . STREPTOCOCCAL PHARYNGITIS 04/08/2009   History reviewed. No pertinent past medical history.  History reviewed. No pertinent family history.  History reviewed. No pertinent surgical history. Social History   Occupational History  . Not on file  Tobacco Use  . Smoking status: Never Smoker  Substance and Sexual Activity  . Alcohol use: Not on file  . Drug use: Not on file  . Sexual activity: Not on file

## 2017-12-05 ENCOUNTER — Other Ambulatory Visit: Payer: Self-pay | Admitting: Family Medicine

## 2017-12-05 DIAGNOSIS — R229 Localized swelling, mass and lump, unspecified: Secondary | ICD-10-CM

## 2017-12-08 ENCOUNTER — Other Ambulatory Visit

## 2017-12-08 ENCOUNTER — Ambulatory Visit
Admission: RE | Admit: 2017-12-08 | Discharge: 2017-12-08 | Disposition: A | Discharge status: Home / Self Care | Source: Ambulatory Visit | Attending: Family Medicine | Admitting: Family Medicine

## 2017-12-08 DIAGNOSIS — R229 Localized swelling, mass and lump, unspecified: Secondary | ICD-10-CM

## 2018-03-13 ENCOUNTER — Other Ambulatory Visit: Payer: Self-pay | Admitting: Family Medicine

## 2018-03-13 DIAGNOSIS — Z1231 Encounter for screening mammogram for malignant neoplasm of breast: Secondary | ICD-10-CM

## 2018-12-11 ENCOUNTER — Ambulatory Visit
Admission: RE | Admit: 2018-12-11 | Discharge: 2018-12-11 | Disposition: A | Discharge status: Home / Self Care | Source: Ambulatory Visit | Attending: Family Medicine | Admitting: Family Medicine

## 2018-12-11 DIAGNOSIS — Z1231 Encounter for screening mammogram for malignant neoplasm of breast: Secondary | ICD-10-CM

## 2019-04-10 ENCOUNTER — Other Ambulatory Visit (HOSPITAL_BASED_OUTPATIENT_CLINIC_OR_DEPARTMENT_OTHER): Payer: Self-pay

## 2019-04-10 DIAGNOSIS — G4710 Hypersomnia, unspecified: Secondary | ICD-10-CM

## 2019-11-04 ENCOUNTER — Encounter (HOSPITAL_BASED_OUTPATIENT_CLINIC_OR_DEPARTMENT_OTHER): Admitting: Internal Medicine

## 2019-11-05 ENCOUNTER — Encounter (HOSPITAL_BASED_OUTPATIENT_CLINIC_OR_DEPARTMENT_OTHER): Admitting: Neurology

## 2019-11-09 ENCOUNTER — Other Ambulatory Visit: Payer: Self-pay | Admitting: Obstetrics and Gynecology

## 2019-11-09 DIAGNOSIS — Z1231 Encounter for screening mammogram for malignant neoplasm of breast: Secondary | ICD-10-CM

## 2019-11-29 ENCOUNTER — Other Ambulatory Visit

## 2019-11-29 ENCOUNTER — Encounter

## 2019-12-13 ENCOUNTER — Ambulatory Visit
Admission: RE | Admit: 2019-12-13 | Discharge: 2019-12-13 | Disposition: A | Discharge status: Home / Self Care | Source: Ambulatory Visit | Attending: Obstetrics and Gynecology | Admitting: Obstetrics and Gynecology

## 2019-12-13 ENCOUNTER — Other Ambulatory Visit: Payer: Self-pay

## 2019-12-13 DIAGNOSIS — Z1231 Encounter for screening mammogram for malignant neoplasm of breast: Secondary | ICD-10-CM

## 2019-12-17 ENCOUNTER — Other Ambulatory Visit: Payer: Self-pay | Admitting: Obstetrics and Gynecology

## 2019-12-17 DIAGNOSIS — R928 Other abnormal and inconclusive findings on diagnostic imaging of breast: Secondary | ICD-10-CM

## 2019-12-27 ENCOUNTER — Encounter

## 2019-12-27 ENCOUNTER — Other Ambulatory Visit

## 2019-12-31 ENCOUNTER — Other Ambulatory Visit

## 2019-12-31 ENCOUNTER — Ambulatory Visit
Admission: RE | Admit: 2019-12-31 | Discharge: 2019-12-31 | Disposition: A | Discharge status: Home / Self Care | Source: Ambulatory Visit | Attending: Obstetrics and Gynecology | Admitting: Obstetrics and Gynecology

## 2019-12-31 ENCOUNTER — Encounter

## 2019-12-31 ENCOUNTER — Other Ambulatory Visit: Payer: Self-pay

## 2019-12-31 DIAGNOSIS — R928 Other abnormal and inconclusive findings on diagnostic imaging of breast: Secondary | ICD-10-CM

## 2020-12-01 ENCOUNTER — Other Ambulatory Visit: Payer: Self-pay | Admitting: Obstetrics and Gynecology

## 2020-12-01 DIAGNOSIS — Z1231 Encounter for screening mammogram for malignant neoplasm of breast: Secondary | ICD-10-CM

## 2021-01-26 ENCOUNTER — Other Ambulatory Visit (HOSPITAL_COMMUNITY): Payer: Self-pay | Admitting: *Deleted

## 2021-01-27 ENCOUNTER — Other Ambulatory Visit (HOSPITAL_COMMUNITY): Payer: Self-pay | Admitting: *Deleted

## 2021-01-30 ENCOUNTER — Inpatient Hospital Stay: Admission: RE | Admit: 2021-01-30 | Source: Ambulatory Visit

## 2021-02-05 ENCOUNTER — Other Ambulatory Visit: Payer: Self-pay

## 2021-02-05 ENCOUNTER — Ambulatory Visit
Admission: RE | Admit: 2021-02-05 | Discharge: 2021-02-05 | Disposition: A | Discharge status: Home / Self Care | Source: Ambulatory Visit | Attending: Obstetrics and Gynecology | Admitting: Obstetrics and Gynecology

## 2021-02-05 ENCOUNTER — Ambulatory Visit (HOSPITAL_COMMUNITY)
Admission: RE | Admit: 2021-02-05 | Discharge: 2021-02-05 | Disposition: A | Discharge status: Home / Self Care | Payer: Self-pay | Source: Ambulatory Visit | Attending: Cardiology | Admitting: Cardiology

## 2021-02-05 DIAGNOSIS — Z1231 Encounter for screening mammogram for malignant neoplasm of breast: Secondary | ICD-10-CM

## 2021-02-19 ENCOUNTER — Telehealth: Payer: Self-pay | Admitting: Plastic Surgery

## 2021-02-19 ENCOUNTER — Other Ambulatory Visit (HOSPITAL_COMMUNITY): Payer: Self-pay

## 2021-02-19 MED ORDER — TETRACAINE POWD
TOPICAL_OINTMENT | Freq: Once | 0 refills | Status: AC
  Filled 2021-02-19: qty 60, 10d supply, fill #0

## 2021-02-19 NOTE — Telephone Encounter (Signed)
Plan for laser therapy.

## 2021-02-26 ENCOUNTER — Encounter: Payer: Self-pay | Admitting: Plastic Surgery

## 2021-02-26 ENCOUNTER — Other Ambulatory Visit: Payer: Self-pay

## 2021-02-26 ENCOUNTER — Ambulatory Visit (INDEPENDENT_AMBULATORY_CARE_PROVIDER_SITE_OTHER): Payer: Self-pay | Admitting: Plastic Surgery

## 2021-02-26 DIAGNOSIS — Z719 Counseling, unspecified: Secondary | ICD-10-CM

## 2021-02-26 NOTE — Progress Notes (Signed)
Sciton Micropeel and touchup with an peel  15 and 4 were utilized. Post care instructions were given and reviewed with the patient.

## 2021-03-13 ENCOUNTER — Encounter: Payer: Self-pay | Admitting: Plastic Surgery

## 2021-03-13 ENCOUNTER — Ambulatory Visit (INDEPENDENT_AMBULATORY_CARE_PROVIDER_SITE_OTHER): Payer: Self-pay | Admitting: Plastic Surgery

## 2021-03-13 ENCOUNTER — Other Ambulatory Visit: Payer: Self-pay

## 2021-03-13 DIAGNOSIS — Z719 Counseling, unspecified: Secondary | ICD-10-CM

## 2021-03-13 NOTE — Progress Notes (Signed)
The patient is a 45 year old female here for follow-up on her face.  She had a peel.  She is doing really well her face looks incredible and much better than her preprocedure.  She is also interested in regiment for her skin and ZIO was suggested and purchased.

## 2021-05-14 ENCOUNTER — Other Ambulatory Visit (HOSPITAL_COMMUNITY): Payer: Self-pay | Admitting: Family Medicine

## 2021-05-14 DIAGNOSIS — Z8249 Family history of ischemic heart disease and other diseases of the circulatory system: Secondary | ICD-10-CM

## 2021-05-18 ENCOUNTER — Other Ambulatory Visit (HOSPITAL_BASED_OUTPATIENT_CLINIC_OR_DEPARTMENT_OTHER): Payer: Self-pay

## 2021-06-12 ENCOUNTER — Other Ambulatory Visit: Payer: Self-pay

## 2021-06-12 ENCOUNTER — Ambulatory Visit (HOSPITAL_COMMUNITY): Attending: Internal Medicine

## 2021-06-12 DIAGNOSIS — Z8249 Family history of ischemic heart disease and other diseases of the circulatory system: Secondary | ICD-10-CM | POA: Insufficient documentation

## 2021-06-12 DIAGNOSIS — Z136 Encounter for screening for cardiovascular disorders: Secondary | ICD-10-CM | POA: Insufficient documentation

## 2021-06-12 LAB — ECHOCARDIOGRAM COMPLETE
Area-P 1/2: 2.75 cm2
S' Lateral: 2.8 cm

## 2021-07-07 ENCOUNTER — Ambulatory Visit (INDEPENDENT_AMBULATORY_CARE_PROVIDER_SITE_OTHER): Payer: Self-pay | Admitting: Plastic Surgery

## 2021-07-07 ENCOUNTER — Other Ambulatory Visit: Payer: Self-pay

## 2021-07-07 ENCOUNTER — Encounter: Payer: Self-pay | Admitting: Plastic Surgery

## 2021-07-07 DIAGNOSIS — Z719 Counseling, unspecified: Secondary | ICD-10-CM

## 2021-07-07 NOTE — Progress Notes (Signed)
Sciton  Preoperative Dx: hyperpigmentation of face  Postoperative Dx:  same  Procedure: laser to face   Anesthesia: none  Description of Procedure:  Risks and complications were explained to the patient. Consent was confirmed and signed. Time out was called and all information was confirmed to be correct. The area  area was prepped with alcohol and wiped dry. The BBL laser was set at 6 J/cm2. The face was lasered. The patient tolerated the procedure well and there were no complications. The patient is to follow up in 4 weeks.   

## 2021-08-04 ENCOUNTER — Ambulatory Visit (INDEPENDENT_AMBULATORY_CARE_PROVIDER_SITE_OTHER): Payer: Self-pay | Admitting: Plastic Surgery

## 2021-08-04 ENCOUNTER — Other Ambulatory Visit: Payer: Self-pay

## 2021-08-04 ENCOUNTER — Encounter: Payer: Self-pay | Admitting: Surgical

## 2021-08-04 DIAGNOSIS — Z719 Counseling, unspecified: Secondary | ICD-10-CM

## 2021-08-04 NOTE — Progress Notes (Signed)
Preoperative Dx: Hyperpigmentation of face  Postoperative Dx:  same  Procedure: laser to face   Anesthesia: none  Description of Procedure:  Risks and complications were explained to the patient. Consent was confirmed and signed. Time out was called and all information was confirmed to be correct. The area  area was prepped with alcohol and wiped dry. The BBL laser was set at 7 J/cm2. The face was lasered. The patient tolerated the procedure well and there were no complications. The patient is to follow up in 4 weeks.

## 2021-09-04 ENCOUNTER — Other Ambulatory Visit: Payer: Self-pay

## 2021-09-04 ENCOUNTER — Encounter: Payer: Self-pay | Admitting: Plastic Surgery

## 2021-09-04 ENCOUNTER — Ambulatory Visit (INDEPENDENT_AMBULATORY_CARE_PROVIDER_SITE_OTHER): Admitting: Plastic Surgery

## 2021-09-04 DIAGNOSIS — Z719 Counseling, unspecified: Secondary | ICD-10-CM

## 2021-09-04 NOTE — Progress Notes (Signed)
Sciton  Preoperative Dx: Hyperpigmentation of face  Postoperative Dx:  same  Procedure: laser to face  Anesthesia: none  Description of Procedure:  Risks and complications were explained to the patient. Consent was confirmed and signed. Time out was called and all information was confirmed to be correct. The area  area was prepped with alcohol and wiped dry. The BBL laser was set at 7 J/cm2 with the 560 nm handpiece actually hurts. The face was lasered.  The handpiece was then switched to 515 nm at 6 J and the face was lasered.  The patient tolerated the procedure well and there were no complications. The patient is to follow up in 4 weeks.

## 2021-09-10 ENCOUNTER — Ambulatory Visit: Admitting: Surgical

## 2021-10-02 ENCOUNTER — Other Ambulatory Visit: Payer: Self-pay

## 2021-10-02 ENCOUNTER — Ambulatory Visit (INDEPENDENT_AMBULATORY_CARE_PROVIDER_SITE_OTHER): Payer: Self-pay | Admitting: Plastic Surgery

## 2021-10-02 ENCOUNTER — Other Ambulatory Visit: Admitting: Plastic Surgery

## 2021-10-02 DIAGNOSIS — Z719 Counseling, unspecified: Secondary | ICD-10-CM

## 2021-10-02 NOTE — Progress Notes (Signed)
Sciton  Preoperative Dx: hyperpigmentation of face  Postoperative Dx:  same  Procedure: laser to face   Anesthesia: none  Description of Procedure:  Risks and complications were explained to the patient. Consent was confirmed and signed. Time out was called and all information was confirmed to be correct. The area  area was prepped with alcohol and wiped dry. The BBL laser was set at 7 J/cm2. The face was lasered with 560 nm and then with 6 J using the 515 nm. The patient tolerated the procedure well and there were no complications. The patient is to follow up in 4 weeks.

## 2021-11-06 ENCOUNTER — Encounter: Payer: Self-pay | Admitting: Plastic Surgery

## 2021-11-06 ENCOUNTER — Ambulatory Visit (INDEPENDENT_AMBULATORY_CARE_PROVIDER_SITE_OTHER): Payer: Self-pay | Admitting: Plastic Surgery

## 2021-11-06 ENCOUNTER — Other Ambulatory Visit: Payer: Self-pay

## 2021-11-06 DIAGNOSIS — Z719 Counseling, unspecified: Secondary | ICD-10-CM

## 2021-11-06 NOTE — Progress Notes (Signed)
Preoperative Dx: Facial pigmentation  Postoperative Dx:  same  Procedure: laser to face  Anesthesia: none  Description of Procedure:  Risks and complications were explained to the patient. Consent was confirmed and signed. Eye protection was placed. Time out was called and all information was confirmed to be correct. The area  area was prepped with alcohol and wiped dry. The BBL laser was set at 560 nm and 7 J/cm2 and 515 nm at 7 J. The face was lasered. The patient tolerated the procedure well and there were no complications. The patient is to follow up in 4 weeks.

## 2021-12-25 ENCOUNTER — Ambulatory Visit (INDEPENDENT_AMBULATORY_CARE_PROVIDER_SITE_OTHER): Payer: Self-pay | Admitting: Plastic Surgery

## 2021-12-25 ENCOUNTER — Other Ambulatory Visit: Payer: Self-pay

## 2021-12-25 ENCOUNTER — Encounter: Payer: Self-pay | Admitting: Plastic Surgery

## 2021-12-25 DIAGNOSIS — Z719 Counseling, unspecified: Secondary | ICD-10-CM

## 2021-12-25 MED ORDER — METHYLPHENIDATE HCL ER (OSM) 54 MG PO TBCR
EXTENDED_RELEASE_TABLET | ORAL | 0 refills | Status: DC
  Filled 2021-12-28: qty 30, 30d supply, fill #0

## 2021-12-25 NOTE — Progress Notes (Signed)
Sciton ? ?Preoperative Dx: hyperpigmentation of fae ? ?Postoperative Dx:  same ? ?Procedure: laser to face  ? ?Anesthesia: none ? ?Description of Procedure:  ?Risks and complications were explained to the patient. Consent was confirmed and signed. Eye protection was placed. Time out was called and all information was confirmed to be correct. The area  area was prepped with alcohol and wiped dry. The BBL laser was set at 590 nm and 7 J/cm2. Then the 515 nm was set at 6 J. The face was lasered. The patient tolerated the procedure well and there were no complications. The patient is to follow up in 4 weeks. ? ? ?

## 2021-12-28 ENCOUNTER — Other Ambulatory Visit: Payer: Self-pay

## 2021-12-30 ENCOUNTER — Other Ambulatory Visit (HOSPITAL_COMMUNITY): Payer: Self-pay

## 2021-12-30 MED ORDER — METHYLPHENIDATE HCL ER (OSM) 54 MG PO TBCR
54.0000 mg | EXTENDED_RELEASE_TABLET | Freq: Every morning | ORAL | 0 refills | Status: DC
  Filled 2021-12-30: qty 30, 30d supply, fill #0

## 2022-01-05 ENCOUNTER — Other Ambulatory Visit: Payer: Self-pay | Admitting: Obstetrics and Gynecology

## 2022-01-05 DIAGNOSIS — Z1231 Encounter for screening mammogram for malignant neoplasm of breast: Secondary | ICD-10-CM

## 2022-02-09 ENCOUNTER — Ambulatory Visit
Admission: RE | Admit: 2022-02-09 | Discharge: 2022-02-09 | Disposition: A | Discharge status: Home / Self Care | Source: Ambulatory Visit | Attending: Obstetrics and Gynecology | Admitting: Obstetrics and Gynecology

## 2022-02-09 DIAGNOSIS — Z1231 Encounter for screening mammogram for malignant neoplasm of breast: Secondary | ICD-10-CM

## 2022-02-10 ENCOUNTER — Other Ambulatory Visit: Payer: Self-pay | Admitting: Obstetrics and Gynecology

## 2022-02-10 DIAGNOSIS — R928 Other abnormal and inconclusive findings on diagnostic imaging of breast: Secondary | ICD-10-CM

## 2022-02-23 ENCOUNTER — Ambulatory Visit

## 2022-02-23 ENCOUNTER — Other Ambulatory Visit: Payer: Self-pay | Admitting: Obstetrics and Gynecology

## 2022-02-23 ENCOUNTER — Ambulatory Visit
Admission: RE | Admit: 2022-02-23 | Discharge: 2022-02-23 | Disposition: A | Discharge status: Home / Self Care | Source: Ambulatory Visit | Attending: Obstetrics and Gynecology | Admitting: Obstetrics and Gynecology

## 2022-02-23 DIAGNOSIS — R928 Other abnormal and inconclusive findings on diagnostic imaging of breast: Secondary | ICD-10-CM

## 2022-04-02 ENCOUNTER — Other Ambulatory Visit: Admitting: Plastic Surgery

## 2022-04-02 ENCOUNTER — Other Ambulatory Visit (HOSPITAL_COMMUNITY): Payer: Self-pay

## 2022-04-02 MED ORDER — AMPHETAMINE SULFATE 10 MG PO TABS
ORAL_TABLET | ORAL | 0 refills | Status: DC
Start: 2022-04-01 — End: 2022-08-30
  Filled 2022-04-02: qty 145, 48d supply, fill #0
  Filled 2022-04-05: qty 125, 42d supply, fill #0

## 2022-04-05 ENCOUNTER — Other Ambulatory Visit (HOSPITAL_COMMUNITY): Payer: Self-pay

## 2022-05-05 ENCOUNTER — Other Ambulatory Visit (HOSPITAL_COMMUNITY): Payer: Self-pay

## 2022-05-05 MED ORDER — CLENPIQ 10-3.5-12 MG-GM -GM/175ML PO SOLN
ORAL | 0 refills | Status: DC
  Filled 2022-05-05: qty 350, 1d supply, fill #0

## 2022-05-06 ENCOUNTER — Other Ambulatory Visit (HOSPITAL_COMMUNITY): Payer: Self-pay

## 2022-06-18 ENCOUNTER — Other Ambulatory Visit (HOSPITAL_COMMUNITY): Payer: Self-pay

## 2022-06-18 MED ORDER — MYDAYIS 12.5 MG PO CP24
12.5000 mg | ORAL_CAPSULE | Freq: Every morning | ORAL | 0 refills | Status: DC
  Filled 2022-06-18: qty 30, 30d supply, fill #0

## 2022-06-29 ENCOUNTER — Other Ambulatory Visit (HOSPITAL_COMMUNITY): Payer: Self-pay

## 2022-07-01 ENCOUNTER — Other Ambulatory Visit (HOSPITAL_COMMUNITY): Payer: Self-pay

## 2022-07-07 ENCOUNTER — Other Ambulatory Visit (HOSPITAL_COMMUNITY): Payer: Self-pay

## 2022-07-28 ENCOUNTER — Other Ambulatory Visit (HOSPITAL_COMMUNITY): Payer: Self-pay

## 2022-07-28 MED ORDER — LAGEVRIO 200 MG PO CAPS
4.0000 | ORAL_CAPSULE | Freq: Two times a day (BID) | ORAL | 0 refills | Status: DC
  Filled 2022-07-28: qty 40, 5d supply, fill #0

## 2022-08-10 ENCOUNTER — Other Ambulatory Visit (HOSPITAL_COMMUNITY): Payer: Self-pay

## 2022-08-26 ENCOUNTER — Ambulatory Visit (INDEPENDENT_AMBULATORY_CARE_PROVIDER_SITE_OTHER): Admitting: Neurology

## 2022-08-26 ENCOUNTER — Encounter: Payer: Self-pay | Admitting: Neurology

## 2022-08-26 VITALS — BP 122/77 | HR 71 | Ht 72.0 in | Wt 156.8 lb

## 2022-08-26 DIAGNOSIS — G43709 Chronic migraine without aura, not intractable, without status migrainosus: Secondary | ICD-10-CM | POA: Diagnosis not present

## 2022-08-26 NOTE — Progress Notes (Signed)
Botox- 200 units x 1 vial Lot: [id number] Expiration: 11/2024 NDC: 6720-9470-96  Bacteriostatic 0.9% Sodium Chloride- 96mL total Lot: [id number] Expiration: 06/26/2023 NDC: 9476-5465-03  Dx: g43.709 Samples

## 2022-08-26 NOTE — Progress Notes (Signed)
[facility]    Provider:  Dr Alicia [name] Requesting Provider: [name], FNP Primary Care Provider:  Maurice [name], Alicia [name]  CC:  migraine  HPI:  Alicia [name], Alicia [name] is a 46 y.o. female here as requested by Alicia Halsted, FNP for migraines. She has tricare. Has had migraines for years. Her migraines starts in her neck and she has a lot of neck tightness. She loves dry needling. She has been on nurtec and loves it. 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 >10 moderate to severe  migraine days a month moderate to severe, > 15 total headache days a month. No other focal neurologic deficits, associated symptoms, inciting events or modifiable factors. Patient has failed multiple medications   Reviewed notes, labs and imaging from outside physicians, which showed:  Medications tried includes: Topiramate(cognitive problems), Amitriptyline(sedation), propranolol contraindicated due to hypotension and bradycardia(vasovagal), effexor, cymbalta, depakote, imitrex, rizatriptan, relpax, zomig, imitrex nasal, nurtec, Ajovy each for > 3 months unless side effects.  Review of Systems: Patient complains of symptoms per HPI as well as the following symptoms migraines. Pertinent negatives and positives per HPI. All others negative.   Social History   Socioeconomic History   Marital status: Married    Spouse name: Not on file   Number of children: Not on file   Years of education: Not on file   Highest education level: Not on file  Occupational History   Not on file  Tobacco Use    Smoking status: Never   Smokeless tobacco: Never  Vaping Use   Vaping Use: Not on file  Substance and Sexual Activity   Alcohol use: Not Currently    Comment: socially   Drug use: Not on file   Sexual activity: Not on file  Other Topics Concern   Not on file  Social History Narrative   Not on file   Social Determinants of Health   Financial Resource Strain: Not on file  Food Insecurity: Not on file  Transportation Needs: Not on file  Physical Activity: Not on file  Stress: Not on file  Social Connections: Not on file  Intimate Partner Violence: Not on file    Family History  Problem Relation Age of Onset   Breast cancer Paternal Grandmother        postmenopausal    Migraines Neg Hx    Headache Neg Hx     Past Medical History:  Diagnosis Date   Migraines     Patient Active Problem List   Diagnosis Date Noted   Encounter for counseling 02/26/2021   Chronic hip pain 08/22/2014   Low back pain, episodic 08/22/2014   STREPTOCOCCAL PHARYNGITIS 04/08/2009    Past Surgical History:  Procedure Laterality Date   HIP ARTHROSCOPY Left     Current Outpatient Medications  Medication Sig Dispense Refill   Rimegepant Sulfate (NURTEC) 75 MG TBDP      TRINTELLIX 20 MG TABS tablet Take 20 mg by mouth daily.     No current facility-administered medications for this visit.    Allergies as of 08/26/2022   (No Known Allergies)    Vitals:  BP 122/77   Pulse 71   Ht 6' (1.829 m)   Wt 156 lb 12.8 oz (71.1 kg)   BMI 21.27 kg/m  Last Weight:  Wt Readings from Last 1 Encounters:  08/26/22 156 lb 12.8 oz (71.1 kg)   Last Height:   Ht Readings from Last 1 Encounters:  08/26/22 6' (1.829 m)     Physical exam: Exam: Gen: NAD, conversant, well nourised, well groomed                     CV: RRR, no MRG. No Carotid Bruits. No peripheral edema, warm, nontender Eyes: Conjunctivae clear without exudates or hemorrhage  Neuro: Detailed Neurologic Exam  Speech:     Speech is normal; fluent and spontaneous with normal comprehension.  Cognition:    The patient is oriented to person, place, and time;     recent and remote memory intact;     language fluent;     normal attention, concentration,     fund of knowledge Cranial Nerves:    The pupils are equal, round, and reactive to light. The fundi are normal and spontaneous venous pulsations are present. Visual fields are full to finger confrontation. Extraocular movements are intact. Trigeminal sensation is intact and the muscles of mastication are normal. The face is symmetric. The palate elevates in the midline. Hearing intact. Voice is normal. Shoulder shrug is normal. The tongue has normal motion without fasciculations.   Coordination:    Normal  Gait:   normal.   Motor Observation:    No asymmetry, no atrophy, and no involuntary movements noted. Tone:    Normal muscle tone.    Posture:    Posture is normal. normal erect    Strength:    Strength is V/V in the upper and lower limbs.      Sensation: intact to LT     Reflex Exam:  DTR's:    Deep tendon reflexes in the upper and lower extremities are normal bilaterally.   Toes:    The toes are downgoing bilaterally.   Clonus:    Clonus is absent.    Assessment/Plan:  Patient with chronic migraines, failed multiple medications. Discussed options today.  Initiate botox for migraines Can continue nurtec prn  To prevent or relieve headaches, try the following: Cool Compress. Lie down and place a cool compress on your head.  Avoid headache triggers. If certain foods or odors seem to have triggered your migraines in the past, avoid them. A headache diary might help you identify triggers.  Include physical activity in your daily routine. Try a daily walk or other moderate aerobic exercise.  Manage stress. Find healthy ways to cope with the stressors, such as delegating tasks on your to-do list.  Practice relaxation techniques. Try deep  breathing, yoga, massage and visualization.  Eat regularly. Eating regularly scheduled meals and maintaining a healthy diet might help prevent headaches. Also, drink plenty of fluids.  Follow a regular sleep schedule. Sleep deprivation might contribute to headaches Consider biofeedback. With this mind-body technique, you learn to control certain bodily functions -- such as muscle tension, heart rate and blood pressure -- to prevent headaches or reduce headache pain.    Proceed to emergency room if you experience new or worsening symptoms or symptoms do not resolve, if you have new neurologic symptoms or if headache is severe, or for any concerning symptom.   Provided education and documentation from American headache Society toolbox including articles on: chronic migraine  medication overuse headache, chronic migraines, prevention of migraines, behavioral and other nonpharmacologic treatments for headache.  Consent Form Botulism Toxin Injection For Chronic Migraine    Reviewed orally with patient, additionally signature is on file:  Botulism toxin has been approved by the Federal drug administration for treatment of chronic migraine. Botulism toxin does not cure chronic migraine and it may not be effective in some patients.  The administration of botulism toxin is accomplished by injecting a [name] amount of toxin into the muscles of the neck and head. Dosage must be titrated for each individual. Any benefits resulting from botulism toxin tend to wear off after 3 months with a repeat injection required if benefit is to be maintained. Injections are usually done every 3-4 months with maximum effect peak achieved by about 2 or 3 weeks. Botulism toxin is expensive and you should be sure of what costs you will incur resulting from the injection.  The side effects of botulism toxin use for chronic migraine may include:   -Transient, and usually mild, facial weakness with facial  injections  -Transient, and usually mild, head or neck weakness with head/neck injections  -Reduction or loss of forehead facial animation due to forehead muscle weakness  -Eyelid drooping  -Dry eye  -Pain at the site of injection or bruising at the site of injection  -Double vision  -Potential unknown long term risks  Contraindications: You should not have Botox if you are pregnant, nursing, allergic to albumin, have an infection, skin condition, or muscle weakness at the site of the injection, or have myasthenia gravis, Lambert-Eaton syndrome, or ALS.  It is also possible that as with any injection, there may be an allergic reaction or no effect from the medication. Reduced effectiveness after repeated injections is sometimes seen and rarely infection at the injection site may occur. All care will be taken to prevent these side effects. If therapy is given over a long time, atrophy and wasting in the muscle injected may occur. Occasionally the patient's become refractory to treatment because they develop antibodies to the toxin. In this event, therapy needs to be modified.  I have read the above information and consent to the administration of botulism toxin.    BOTOX PROCEDURE NOTE FOR MIGRAINE HEADACHE    Contraindications and precautions discussed with patient(above). Aseptic procedure was observed and patient tolerated procedure. Procedure performed by Dr. Artemio Aly  The condition has existed for more than 6 months, and pt does not have a diagnosis of ALS, Myasthenia Gravis or Lambert-Eaton Syndrome.  Risks and benefits of injections discussed and pt agrees to proceed with the procedure.  Written consent obtained  These injections are medically necessary. Pt  receives good benefits from these injections. These injections do not cause sedations or hallucinations which the oral therapies may cause.  Description of procedure:  The patient was placed in a sitting position. The  standard protocol was used for Botox as follows, with 5 units of Botox injected at each site:   -Procerus muscle, midline injection  -Corrugator muscle, bilateral injection  -Frontalis muscle, bilateral injection, with 2 sites each side, medial injection was performed in the upper one third of the frontalis muscle, in the region vertical from the medial inferior edge of the superior orbital rim. The lateral injection was again in the upper one third of the forehead vertically above the lateral limbus of the cornea, 1.5 cm lateral to the medial injection site.  -Temporalis muscle injection, 4 sites, bilaterally. The first injection  was 3 cm above the tragus of the ear, second injection site was 1.5 cm to 3 cm up from the first injection site in line with the tragus of the ear. The third injection site was 1.5-3 cm forward between the first 2 injection sites. The fourth injection site was 1.5 cm posterior to the second injection site.   -Occipitalis muscle injection, 3 sites, bilaterally. The first injection was done one half way between the occipital protuberance and the tip of the mastoid process behind the ear. The second injection site was done lateral and superior to the first, 1 fingerbreadth from the first injection. The third injection site was 1 fingerbreadth superiorly and medially from the first injection site.  -Cervical paraspinal muscle injection, 2 sites, bilateral knee first injection site was 1 cm from the midline of the cervical spine, 3 cm inferior to the lower border of the occipital protuberance. The second injection site was 1.5 cm superiorly and laterally to the first injection site.  -Trapezius muscle injection was performed at 3 sites, bilaterally. The first injection site was in the upper trapezius muscle halfway between the inflection point of the neck, and the acromion. The second injection site was one half way between the acromion and the first injection site. The third  injection was done between the first injection site and the inflection point of the neck.   Will return for repeat injection in 3 months.   200 units of Botox was used, any Botox not injected was wasted. The patient tolerated the procedure well, there were no complications of the above procedure.  USED SAMPLES  No orders of the defined types were placed in this encounter.  No orders of the defined types were placed in this encounter.   Cc: Camie Patience, FNP,  Shirlean Mylar, Alicia [name]  Naomie Dean, Alicia [name]  [facility] [address] [place], [place] [address]  Phone [phone] Fax [phone]  I spent 60 minutes of face-to-face and non-face-to-face time with patient on the  1. Chronic migraine without aura without status migrainosus, not intractable    diagnosis.  This included previsit chart review, lab review, study review, order entry, electronic health record documentation, patient education on the different diagnostic and therapeutic options, counseling and coordination of care, risks and benefits of management, compliance, or risk factor reduction

## 2022-08-30 ENCOUNTER — Encounter: Payer: Self-pay | Admitting: Neurology

## 2022-08-30 NOTE — Patient Instructions (Signed)
Initiate botox for migraines Can continue nurtec prn  OnabotulinumtoxinA Injection (Medical Use) What is this medication? ONABOTULINUMTOXINA (o na BOTT you lye num tox in eh) treats severe muscle spasms. It may also be used to prevent migraine headaches. It can treat excessive sweating when other medications do not work well enough. This medicine may be used for other purposes; ask your health care provider or pharmacist if you have questions. COMMON BRAND NAME(S): Botox What should I tell my care team before I take this medication? They need to know if you have any of these conditions: Breathing problems Cerebral palsy spasms Difficulty urinating Heart problems History of surgery where this medication is going to be used Infection at the site where this medication is going to be used Myasthenia gravis or other neurologic disease Nerve or muscle disease Surgery plans Take medications that treat or prevent blood clots Thyroid problems An unusual or allergic reaction to botulinum toxin, albumin, other medications, foods, dyes, or preservatives Pregnant or trying to get pregnant Breast-feeding How should I use this medication? This medication is for injected into a muscle. It is given by your care team in a hospital or clinic setting. A special MedGuide will be given to you before each treatment. Be sure to read this information carefully each time. Talk to your care team about the use of this medication in children. While this medication may be prescribed for children as young as 2 years for selected conditions, precautions do apply. Overdosage: If you think you have taken too much of this medicine contact a poison control center or emergency room at once. NOTE: This medicine is only for you. Do not share this medicine with others. What if I miss a dose? This does not apply. What may interact with this medication? Aminoglycoside antibiotics, such as gentamicin, neomycin,  tobramycin Muscle relaxants Other botulinum toxin injections This list may not describe all possible interactions. Give your health care provider a list of all the medicines, herbs, non-prescription drugs, or dietary supplements you use. Also tell them if you smoke, drink alcohol, or use illegal drugs. Some items may interact with your medicine. What should I watch for while using this medication? Visit your care team for regular check ups. This medication will cause weakness in the muscle where it is injected. Tell your care team if you feel unusually weak in other muscles. Get medical help right away if you have problems with breathing, swallowing, or talking. This medication might make your eyelids droop or make you see blurry or double. If you have weak muscles or trouble seeing do not drive a car, use machinery, or do other dangerous activities. This medication contains albumin from human blood. It may be possible to pass an infection in this medication, but no cases have been reported. Talk to your care team about the risks and benefits of this medication. If your activities have been limited by your condition, go back to your regular routine slowly after treatment with this medication. What side effects may I notice from receiving this medication? Side effects that you should report to your care team as soon as possible: Allergic reactions--skin rash, itching, hives, swelling of the face, lips, tongue, or throat Dryness or irritation of the eyes, eye pain, change in vision, sensitivity to light Infection--fever, chills, cough, sore throat, wounds that don't heal, pain or trouble when passing urine, general feeling of discomfort or being unwell Spread of botulinum toxin effects--unusual weakness or fatigue, blurry or double vision, trouble swallowing,  hoarseness or trouble speaking, trouble breathing, loss of bladder control Trouble passing urine Side effects that usually do not require  medical attention (report these to your care team if they continue or are bothersome): Dry mouth Eyelid drooping Fatigue Headache Pain, redness, or irritation at injection site This list may not describe all possible side effects. Call your doctor for medical advice about side effects. You may report side effects to FDA at 1-800-FDA-1088. Where should I keep my medication? This medication is given in a hospital or clinic and will not be stored at home. NOTE: This sheet is a summary. It may not cover all possible information. If you have questions about this medicine, talk to your doctor, pharmacist, or health care provider.  2023 Elsevier/Gold Standard (2021-08-24 00:00:00)

## 2022-08-31 ENCOUNTER — Telehealth: Payer: Self-pay | Admitting: Neurology

## 2022-08-31 NOTE — Telephone Encounter (Signed)
Chronic Migraine CPT 64615  Botox J0585 Units: 200  G43.709 Chronic Migraine without aura, not intractable, without status migrainous  New start Botox

## 2022-08-31 NOTE — Telephone Encounter (Signed)
Dr. Jaynee Eagles would like to get this patient started on botox for migraines please

## 2022-09-01 ENCOUNTER — Other Ambulatory Visit (HOSPITAL_COMMUNITY): Payer: Self-pay

## 2022-09-01 NOTE — Telephone Encounter (Signed)
[employer] Benefit Verification [id number] Submitted!

## 2022-09-02 ENCOUNTER — Other Ambulatory Visit (HOSPITAL_COMMUNITY): Payer: Self-pay

## 2022-09-12 IMAGING — US US BREAST*L* LIMITED INC AXILLA
1 series · 3 of 3 positions shown · non-contrast
Comparison: Prior mammograms dating back to 5177.

CLINICAL DATA: 45-year-old female for further evaluation of
possible LOWER LEFT breast distortion on screening mammogram and new
palpable thickening in the UPPER LEFT breast discovered on
self-examination.

EXAM:
DIGITAL DIAGNOSTIC UNILATERAL LEFT MAMMOGRAM WITH TOMOSYNTHESIS AND
CAD; ULTRASOUND LEFT BREAST LIMITED
TECHNIQUE: Left digital diagnostic mammography and breast tomosynthesis was
performed. The images were evaluated with computer-aided detection.;
Targeted ultrasound examination of the left breast was performed.

[Series 1: us breast*left* limited inc axilla · 0.05mm/px · 3 of 3 slices shown]
[im 1/3]
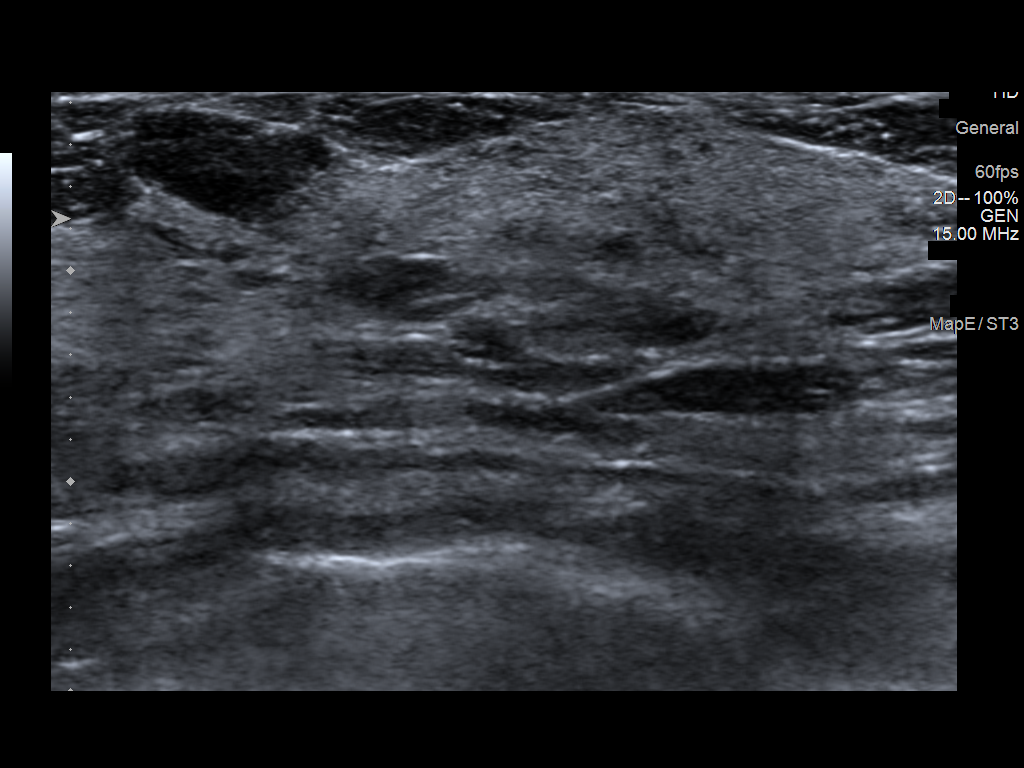
[im 2/3]
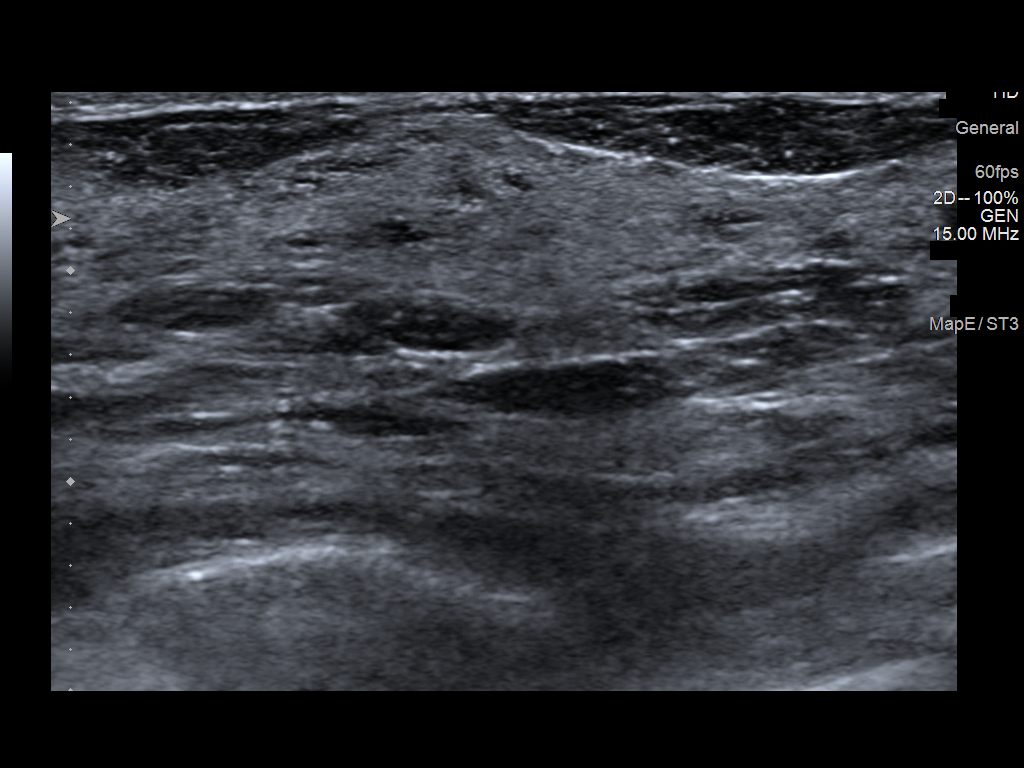
[im 3/3]
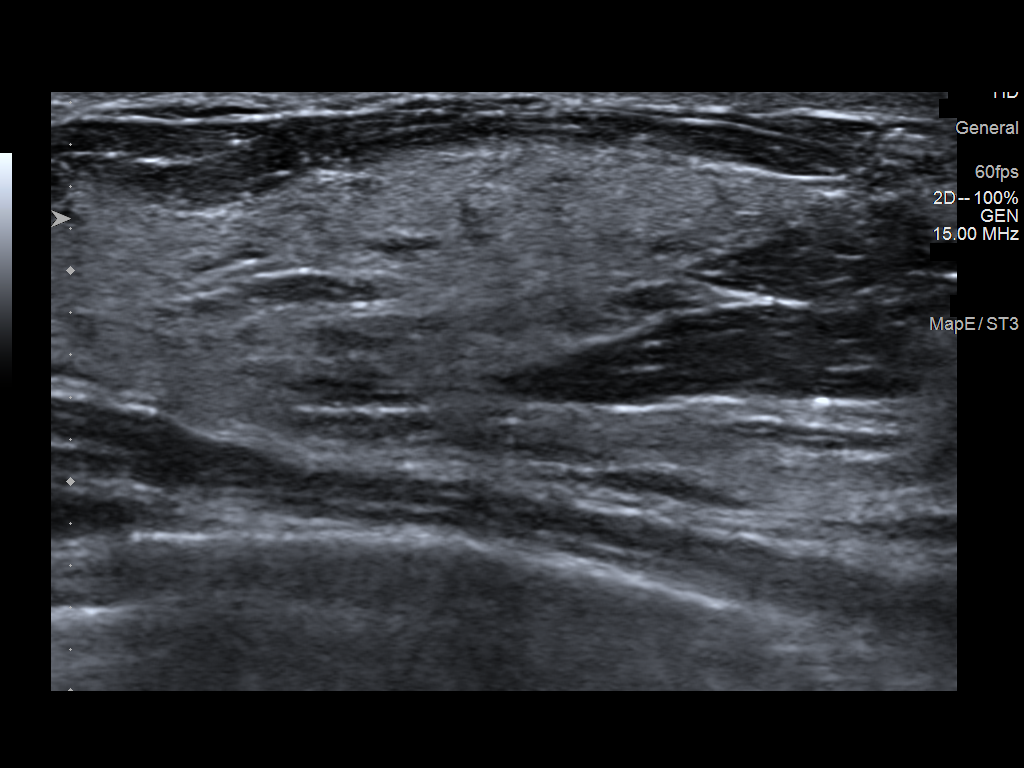

[3 of 3 positions shown; findings below may reference images not displayed]

ACR Breast Density Category c: The breast tissue is heterogeneously
dense, which may obscure small masses.
FINDINGS: 2D/3D spot compression views of the LEFT breast demonstrate no
persistent abnormality or distortion at the site of the screening
study finding.

Targeted ultrasound is performed, showing no sonographic abnormality
within the central or LOWER LEFT breast in the expected region of
the screening study finding.

Normal dense fibroglandular tissue in the UPPER LEFT breast is
identified at the site of patient concern.
IMPRESSION: 1. No persistent mammographic or sonographic abnormality at the site
of the screening study finding.
2. No sonographic abnormality in the UPPER LEFT breast, at the site
of thickening. Normal dense fibroglandular tissue is identified in
this region.

RECOMMENDATION:
Bilateral screening mammogram in 1 year.

I have discussed the findings and recommendations with the patient.
If applicable, a reminder letter will be sent to the patient
regarding the next appointment.

BI-RADS CATEGORY  1: Negative.

## 2022-09-12 IMAGING — MG MM DIGITAL DIAGNOSTIC UNILAT*L* W/ TOMO W/ CAD
4 series · 4 of 12 positions shown · non-contrast
Comparison: Prior mammograms dating back to 5177.

CLINICAL DATA: 45-year-old female for further evaluation of
possible LOWER LEFT breast distortion on screening mammogram and new
palpable thickening in the UPPER LEFT breast discovered on
self-examination.

EXAM:
DIGITAL DIAGNOSTIC UNILATERAL LEFT MAMMOGRAM WITH TOMOSYNTHESIS AND
CAD; ULTRASOUND LEFT BREAST LIMITED
TECHNIQUE: Left digital diagnostic mammography and breast tomosynthesis was
performed. The images were evaluated with computer-aided detection.;
Targeted ultrasound examination of the left breast was performed.

[L MLO synth-2D]
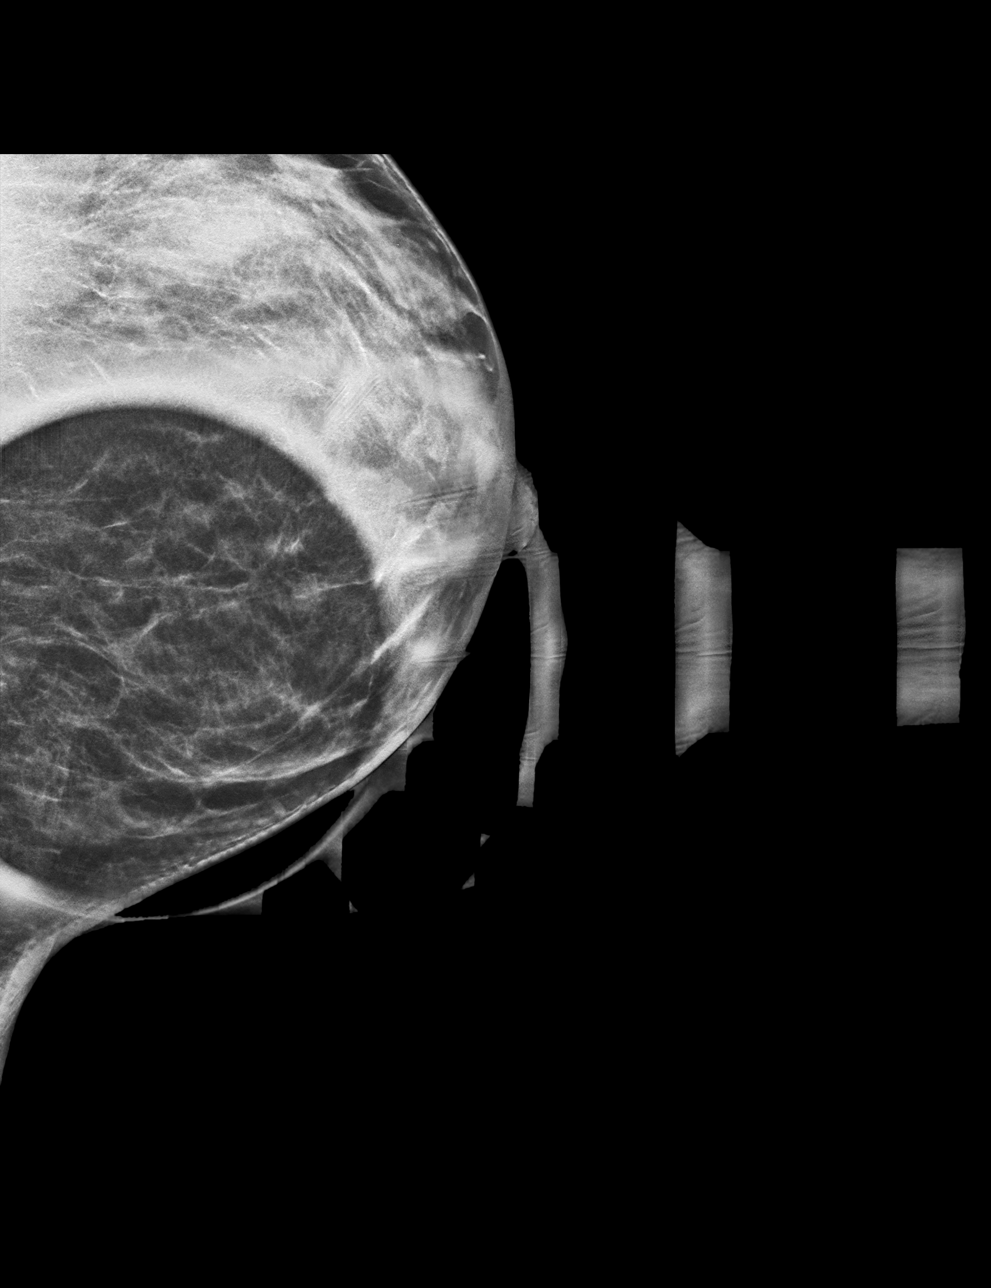

[L CC synth-2D]
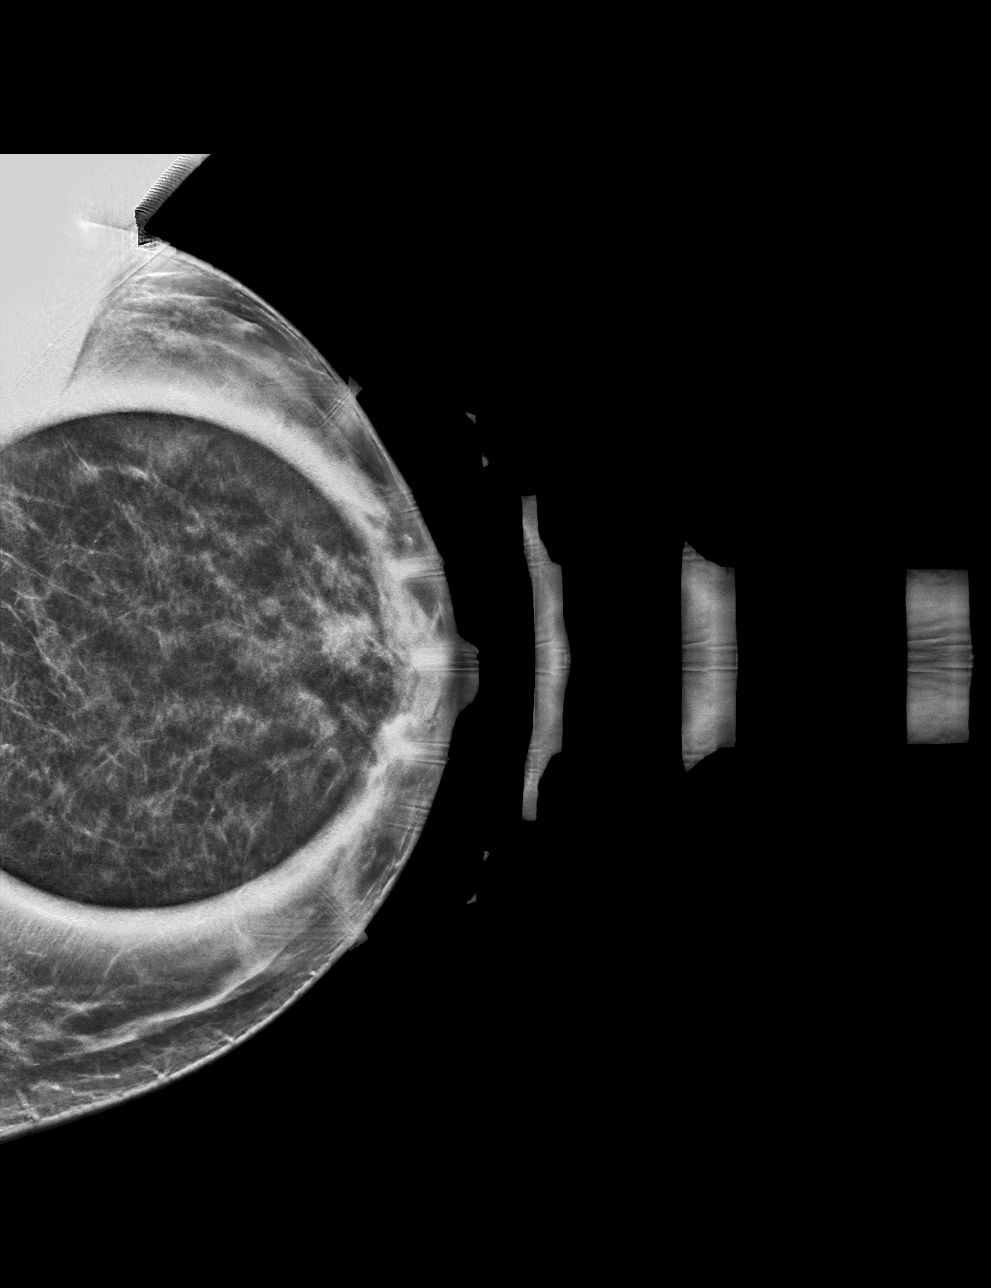

[L MLO tomo · tomo slice 22/43.0]
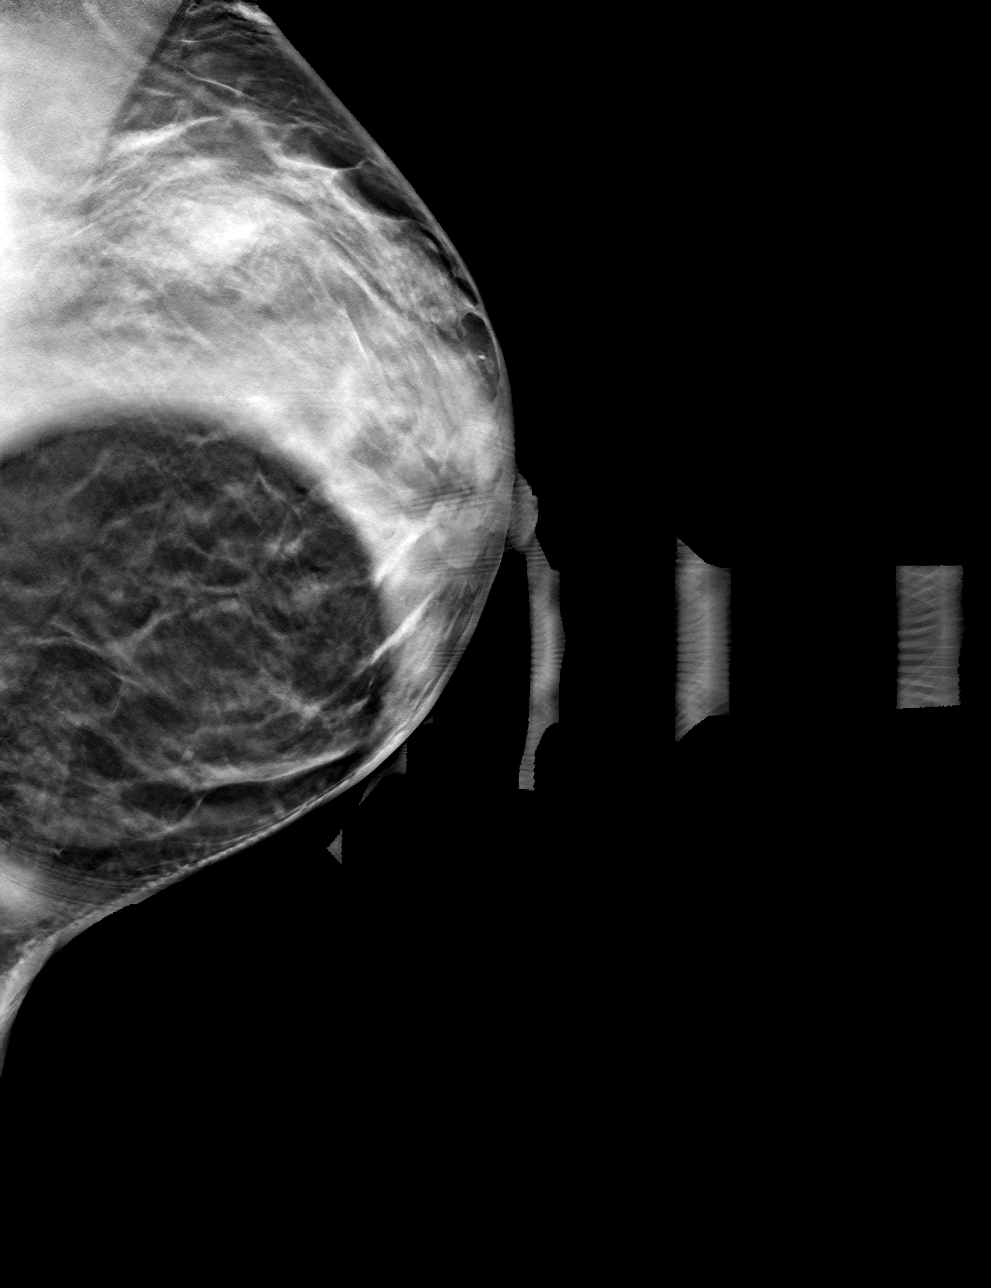

[L CC tomo · tomo slice 25/49.0]
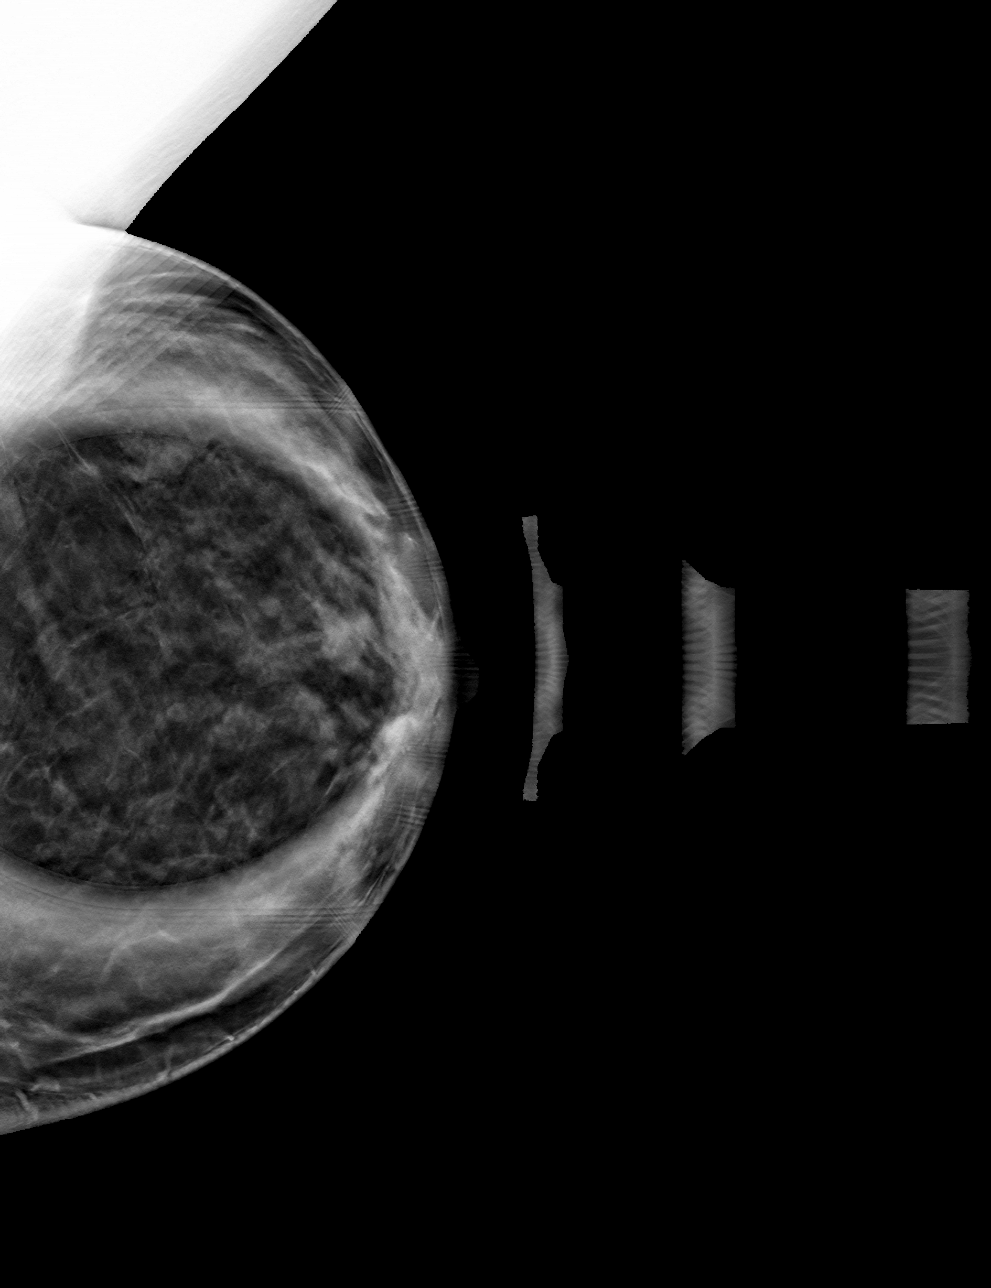

[4 of 12 positions shown; findings below may reference images not displayed]

ACR Breast Density Category c: The breast tissue is heterogeneously
dense, which may obscure small masses.
FINDINGS: 2D/3D spot compression views of the LEFT breast demonstrate no
persistent abnormality or distortion at the site of the screening
study finding.

Targeted ultrasound is performed, showing no sonographic abnormality
within the central or LOWER LEFT breast in the expected region of
the screening study finding.

Normal dense fibroglandular tissue in the UPPER LEFT breast is
identified at the site of patient concern.
IMPRESSION: 1. No persistent mammographic or sonographic abnormality at the site
of the screening study finding.
2. No sonographic abnormality in the UPPER LEFT breast, at the site
of thickening. Normal dense fibroglandular tissue is identified in
this region.

RECOMMENDATION:
Bilateral screening mammogram in 1 year.

I have discussed the findings and recommendations with the patient.
If applicable, a reminder letter will be sent to the patient
regarding the next appointment.

BI-RADS CATEGORY  1: Negative.

## 2022-09-23 NOTE — Telephone Encounter (Signed)
Just to update trying to getting provider portal access to [employer] in order to complete Prior Authorization     

## 2022-10-11 NOTE — Telephone Encounter (Signed)
If insurance has approved botox use B&B let the patient know thanks

## 2022-10-11 NOTE — Telephone Encounter (Signed)
This is covered under [employer] and [employer]

## 2022-10-28 ENCOUNTER — Other Ambulatory Visit (HOSPITAL_COMMUNITY): Payer: Self-pay

## 2022-10-28 MED ORDER — METRONIDAZOLE 0.75 % EX CREA
TOPICAL_CREAM | CUTANEOUS | 5 refills | Status: DC
  Filled 2022-10-28: qty 45, 30d supply, fill #0

## 2022-11-02 ENCOUNTER — Ambulatory Visit: Admitting: Neurology

## 2022-11-02 DIAGNOSIS — G43709 Chronic migraine without aura, not intractable, without status migrainosus: Secondary | ICD-10-CM

## 2022-11-02 MED ORDER — ONABOTULINUMTOXINA 200 UNITS IJ SOLR
155.0000 [IU] | Freq: Once | INTRAMUSCULAR | Status: AC
  Administered 2022-11-02: 155 [IU] via INTRAMUSCULAR

## 2022-11-02 NOTE — Progress Notes (Unsigned)
Botox- 200 units x 1 vial Lot: [id number] Expiration: 02/2025 NDC: 0023-3921-02  Bacteriostatic 0.9% Sodium Chloride- 4mL total Lot: [id number] Expiration: 06/26/2023 NDC: 0409-1966-02  Dx: G43.709 B/B  

## 2022-11-04 ENCOUNTER — Other Ambulatory Visit (HOSPITAL_COMMUNITY): Payer: Self-pay

## 2022-11-04 MED ORDER — DOXYCYCLINE HYCLATE 100 MG PO CAPS
ORAL_CAPSULE | ORAL | 0 refills | Status: DC
  Filled 2022-11-04: qty 60, 30d supply, fill #0

## 2022-11-04 NOTE — Progress Notes (Signed)
Consent Form Botulism Toxin Injection For Chronic Migraine  +a. See pictures  Reviewed orally with patient, additionally signature is on file:  Botulism toxin has been approved by the Federal drug administration for treatment of chronic migraine. Botulism toxin does not cure chronic migraine and it may not be effective in some patients.  The administration of botulism toxin is accomplished by injecting a small amount of toxin into the muscles of the neck and head. Dosage must be titrated for each individual. Any benefits resulting from botulism toxin tend to wear off after 3 months with a repeat injection required if benefit is to be maintained. Injections are usually done every 3-4 months with maximum effect peak achieved by about 2 or 3 weeks. Botulism toxin is expensive and you should be sure of what costs you will incur resulting from the injection.  The side effects of botulism toxin use for chronic migraine may include:   -Transient, and usually mild, facial weakness with facial injections  -Transient, and usually mild, head or neck weakness with head/neck injections  -Reduction or loss of forehead facial animation due to forehead muscle weakness  -Eyelid drooping  -Dry eye  -Pain at the site of injection or bruising at the site of injection  -Double vision  -Potential unknown long term risks  Contraindications: You should not have Botox if you are pregnant, nursing, allergic to albumin, have an infection, skin condition, or muscle weakness at the site of the injection, or have myasthenia gravis, Lambert-Eaton syndrome, or ALS.  It is also possible that as with any injection, there may be an allergic reaction or no effect from the medication. Reduced effectiveness after repeated injections is sometimes seen and rarely infection at the injection site may occur. All care will be taken to prevent these side effects. If therapy is given over a long time, atrophy and wasting in the muscle  injected may occur. Occasionally the patient's become refractory to treatment because they develop antibodies to the toxin. In this event, therapy needs to be modified.  I have read the above information and consent to the administration of botulism toxin.    BOTOX PROCEDURE NOTE FOR MIGRAINE HEADACHE    Contraindications and precautions discussed with patient(above). Aseptic procedure was observed and patient tolerated procedure. Procedure performed by Dr. Georgia Dom  The condition has existed for more than 6 months, and pt does not have a diagnosis of ALS, Myasthenia Gravis or Lambert-Eaton Syndrome.  Risks and benefits of injections discussed and pt agrees to proceed with the procedure.  Written consent obtained  These injections are medically necessary. Pt  receives good benefits from these injections. These injections do not cause sedations or hallucinations which the oral therapies may cause.  Description of procedure:  The patient was placed in a sitting position. The standard protocol was used for Botox as follows, with 5 units of Botox injected at each site:   -Procerus muscle, midline injection  -Corrugator muscle, bilateral injection  -Frontalis muscle, bilateral injection, with 2 sites each side, medial injection was performed in the upper one third of the frontalis muscle, in the region vertical from the medial inferior edge of the superior orbital rim. The lateral injection was again in the upper one third of the forehead vertically above the lateral limbus of the cornea, 1.5 cm lateral to the medial injection site.  -Temporalis muscle injection, 4 sites, bilaterally. The first injection was 3 cm above the tragus of the ear, second injection site was 1.5 cm  to 3 cm up from the first injection site in line with the tragus of the ear. The third injection site was 1.5-3 cm forward between the first 2 injection sites. The fourth injection site was 1.5 cm posterior to the second  injection site.   -Occipitalis muscle injection, 3 sites, bilaterally. The first injection was done one half way between the occipital protuberance and the tip of the mastoid process behind the ear. The second injection site was done lateral and superior to the first, 1 fingerbreadth from the first injection. The third injection site was 1 fingerbreadth superiorly and medially from the first injection site.  -Cervical paraspinal muscle injection, 2 sites, bilateral knee first injection site was 1 cm from the midline of the cervical spine, 3 cm inferior to the lower border of the occipital protuberance. The second injection site was 1.5 cm superiorly and laterally to the first injection site.  -Trapezius muscle injection was performed at 3 sites, bilaterally. The first injection site was in the upper trapezius muscle halfway between the inflection point of the neck, and the acromion. The second injection site was one half way between the acromion and the first injection site. The third injection was done between the first injection site and the inflection point of the neck.   Will return for repeat injection in 3 months.   200 units of Botox was used, 45 Botox not injected was wasted. The patient tolerated the procedure well, there were no complications of the above procedure.

## 2022-12-03 ENCOUNTER — Other Ambulatory Visit (HOSPITAL_COMMUNITY): Payer: Self-pay

## 2022-12-03 MED ORDER — CIPROFLOXACIN HCL 500 MG PO TABS
500.0000 mg | ORAL_TABLET | Freq: Two times a day (BID) | ORAL | 0 refills | Status: DC
  Filled 2022-12-03: qty 6, 3d supply, fill #0

## 2022-12-06 ENCOUNTER — Other Ambulatory Visit (HOSPITAL_COMMUNITY): Payer: Self-pay

## 2022-12-24 ENCOUNTER — Other Ambulatory Visit (HOSPITAL_BASED_OUTPATIENT_CLINIC_OR_DEPARTMENT_OTHER): Payer: Self-pay

## 2022-12-27 ENCOUNTER — Other Ambulatory Visit (HOSPITAL_COMMUNITY): Payer: Self-pay

## 2022-12-27 NOTE — Telephone Encounter (Signed)
Does pt need new auth or is she still good for 01/24/23 appt? I'm not seeing any approval dates in the chart

## 2022-12-27 NOTE — Telephone Encounter (Signed)
Benefit Verification [id number] Submitted!

## 2023-01-05 ENCOUNTER — Other Ambulatory Visit (HOSPITAL_COMMUNITY): Payer: Self-pay

## 2023-01-06 ENCOUNTER — Other Ambulatory Visit (HOSPITAL_COMMUNITY): Payer: Self-pay

## 2023-01-11 NOTE — Telephone Encounter (Signed)
Checking to see if you got access for this auth

## 2023-01-14 NOTE — Telephone Encounter (Signed)
     This has been approved-[employer].

## 2023-01-24 ENCOUNTER — Ambulatory Visit (INDEPENDENT_AMBULATORY_CARE_PROVIDER_SITE_OTHER): Admitting: Neurology

## 2023-01-24 VITALS — BP 129/84 | HR 77

## 2023-01-24 DIAGNOSIS — G43709 Chronic migraine without aura, not intractable, without status migrainosus: Secondary | ICD-10-CM

## 2023-01-24 MED ORDER — ONABOTULINUMTOXINA 100 UNITS IJ SOLR
155.0000 [IU] | Freq: Once | INTRAMUSCULAR | Status: AC
  Administered 2023-01-24: 155 [IU] via INTRAMUSCULAR

## 2023-01-24 NOTE — Progress Notes (Signed)
Botox- 100 units x 2 vials Lot: [id number], [id number] Expiration: 01/2025, 01/2025 NDC: [id number]  Bacteriostatic 0.9% Sodium Chloride- 4 mL total Lot: [id number] Expiration: 11/25 NDC: [id number]  Dx: [id number] B&B Witnessed by DIAMOND

## 2023-01-24 NOTE — Progress Notes (Signed)
Consent Form Botulism Toxin Injection For Chronic Migraine  01/24/2023: +a. See pictures > 70% improval in migraine freq and severity she is thrilled  Reviewed orally with patient, additionally signature is on file:  Botulism toxin has been approved by the Federal drug administration for treatment of chronic migraine. Botulism toxin does not cure chronic migraine and it may not be effective in some patients.  The administration of botulism toxin is accomplished by injecting a small amount of toxin into the muscles of the neck and head. Dosage must be titrated for each individual. Any benefits resulting from botulism toxin tend to wear off after 3 months with a repeat injection required if benefit is to be maintained. Injections are usually done every 3-4 months with maximum effect peak achieved by about 2 or 3 weeks. Botulism toxin is expensive and you should be sure of what costs you will incur resulting from the injection.  The side effects of botulism toxin use for chronic migraine may include:   -Transient, and usually mild, facial weakness with facial injections  -Transient, and usually mild, head or neck weakness with head/neck injections  -Reduction or loss of forehead facial animation due to forehead muscle weakness  -Eyelid drooping  -Dry eye  -Pain at the site of injection or bruising at the site of injection  -Double vision  -Potential unknown long term risks  Contraindications: You should not have Botox if you are pregnant, nursing, allergic to albumin, have an infection, skin condition, or muscle weakness at the site of the injection, or have myasthenia gravis, Lambert-Eaton syndrome, or ALS.  It is also possible that as with any injection, there may be an allergic reaction or no effect from the medication. Reduced effectiveness after repeated injections is sometimes seen and rarely infection at the injection site may occur. All care will be taken to prevent these side effects. If  therapy is given over a long time, atrophy and wasting in the muscle injected may occur. Occasionally the patient's become refractory to treatment because they develop antibodies to the toxin. In this event, therapy needs to be modified.  I have read the above information and consent to the administration of botulism toxin.    BOTOX PROCEDURE NOTE FOR MIGRAINE HEADACHE    Contraindications and precautions discussed with patient(above). Aseptic procedure was observed and patient tolerated procedure. Procedure performed by Dr. Georgia Dom  The condition has existed for more than 6 months, and pt does not have a diagnosis of ALS, Myasthenia Gravis or Lambert-Eaton Syndrome.  Risks and benefits of injections discussed and pt agrees to proceed with the procedure.  Written consent obtained  These injections are medically necessary. Pt  receives good benefits from these injections. These injections do not cause sedations or hallucinations which the oral therapies may cause.  Description of procedure:  The patient was placed in a sitting position. The standard protocol was used for Botox as follows, with 5 units of Botox injected at each site:   -Procerus muscle, midline injection  -Corrugator muscle, bilateral injection  -Frontalis muscle, bilateral injection, with 2 sites each side, medial injection was performed in the upper one third of the frontalis muscle, in the region vertical from the medial inferior edge of the superior orbital rim. The lateral injection was again in the upper one third of the forehead vertically above the lateral limbus of the cornea, 1.5 cm lateral to the medial injection site.  -Temporalis muscle injection, 4 sites, bilaterally. The first injection was 3 cm  above the tragus of the ear, second injection site was 1.5 cm to 3 cm up from the first injection site in line with the tragus of the ear. The third injection site was 1.5-3 cm forward between the first 2 injection  sites. The fourth injection site was 1.5 cm posterior to the second injection site.   -Occipitalis muscle injection, 3 sites, bilaterally. The first injection was done one half way between the occipital protuberance and the tip of the mastoid process behind the ear. The second injection site was done lateral and superior to the first, 1 fingerbreadth from the first injection. The third injection site was 1 fingerbreadth superiorly and medially from the first injection site.  -Cervical paraspinal muscle injection, 2 sites, bilateral knee first injection site was 1 cm from the midline of the cervical spine, 3 cm inferior to the lower border of the occipital protuberance. The second injection site was 1.5 cm superiorly and laterally to the first injection site.  -Trapezius muscle injection was performed at 3 sites, bilaterally. The first injection site was in the upper trapezius muscle halfway between the inflection point of the neck, and the acromion. The second injection site was one half way between the acromion and the first injection site. The third injection was done between the first injection site and the inflection point of the neck.   Will return for repeat injection in 3 months.   200 units of Botox was used, 45 Botox not injected was wasted. The patient tolerated the procedure well, there were no complications of the above procedure.

## 2023-01-27 ENCOUNTER — Other Ambulatory Visit: Payer: Self-pay | Admitting: Obstetrics and Gynecology

## 2023-01-27 DIAGNOSIS — Z1231 Encounter for screening mammogram for malignant neoplasm of breast: Secondary | ICD-10-CM

## 2023-02-09 ENCOUNTER — Encounter: Payer: Self-pay | Admitting: Neurology

## 2023-02-09 ENCOUNTER — Other Ambulatory Visit (HOSPITAL_COMMUNITY): Payer: Self-pay

## 2023-02-09 MED ORDER — NURTEC 75 MG PO TBDP
ORAL_TABLET | ORAL | 11 refills | Status: DC
  Filled 2023-02-09: qty 6, 30d supply, fill #0
  Filled 2023-03-28 – 2023-03-30 (×2): qty 6, 30d supply, fill #1
  Filled 2023-04-27: qty 6, 30d supply, fill #2
  Filled 2023-05-27: qty 6, 30d supply, fill #3

## 2023-02-24 ENCOUNTER — Other Ambulatory Visit (HOSPITAL_COMMUNITY): Payer: Self-pay

## 2023-02-24 MED ORDER — AMPHETAMINE SULFATE 10 MG PO TABS
30.0000 mg | ORAL_TABLET | Freq: Every morning | ORAL | 0 refills | Status: AC
  Filled 2023-02-24: qty 270, 90d supply, fill #0

## 2023-02-25 ENCOUNTER — Other Ambulatory Visit (HOSPITAL_COMMUNITY): Payer: Self-pay

## 2023-02-28 ENCOUNTER — Telehealth: Payer: Self-pay | Admitting: *Deleted

## 2023-02-28 ENCOUNTER — Other Ambulatory Visit (HOSPITAL_COMMUNITY): Payer: Self-pay

## 2023-02-28 NOTE — Telephone Encounter (Signed)
Completed Nurtec PA on CMM. Key: [id number]. Awaiting determination from [employer]

## 2023-03-02 ENCOUNTER — Other Ambulatory Visit: Payer: Self-pay

## 2023-03-02 ENCOUNTER — Other Ambulatory Visit (HOSPITAL_COMMUNITY): Payer: Self-pay

## 2023-03-03 ENCOUNTER — Ambulatory Visit
Admission: RE | Admit: 2023-03-03 | Discharge: 2023-03-03 | Disposition: A | Discharge status: Home / Self Care | Source: Ambulatory Visit | Attending: Obstetrics and Gynecology | Admitting: Obstetrics and Gynecology

## 2023-03-03 ENCOUNTER — Other Ambulatory Visit (HOSPITAL_COMMUNITY): Payer: Self-pay

## 2023-03-03 DIAGNOSIS — Z1231 Encounter for screening mammogram for malignant neoplasm of breast: Secondary | ICD-10-CM

## 2023-03-29 ENCOUNTER — Encounter: Payer: Self-pay | Admitting: Pharmacist

## 2023-03-29 ENCOUNTER — Other Ambulatory Visit: Payer: Self-pay

## 2023-03-30 ENCOUNTER — Other Ambulatory Visit: Payer: Self-pay

## 2023-03-30 ENCOUNTER — Other Ambulatory Visit (HOSPITAL_COMMUNITY): Payer: Self-pay

## 2023-04-19 ENCOUNTER — Ambulatory Visit: Admitting: Neurology

## 2023-04-19 DIAGNOSIS — G43709 Chronic migraine without aura, not intractable, without status migrainosus: Secondary | ICD-10-CM

## 2023-04-19 MED ORDER — ONABOTULINUMTOXINA 200 UNITS IJ SOLR
155.0000 [IU] | Freq: Once | INTRAMUSCULAR | Status: AC
  Administered 2023-04-19: 155 [IU] via INTRAMUSCULAR

## 2023-04-19 NOTE — Patient Instructions (Addendum)
Consent Form Botulism Toxin Injection For Chronic Migraine    04/19/2023: stable  01/24/2023: +a. See pictures > 70% improval in migraine freq and severity she is thrilled  Reviewed orally with patient, additionally signature is on file:  Botulism toxin has been approved by the Federal drug administration for treatment of chronic migraine. Botulism toxin does not cure chronic migraine and it may not be effective in some patients.  The administration of botulism toxin is accomplished by injecting a small amount of toxin into the muscles of the neck and head. Dosage must be titrated for each individual. Any benefits resulting from botulism toxin tend to wear off after 3 months with a repeat injection required if benefit is to be maintained. Injections are usually done every 3-4 months with maximum effect peak achieved by about 2 or 3 weeks. Botulism toxin is expensive and you should be sure of what costs you will incur resulting from the injection.  The side effects of botulism toxin use for chronic migraine may include:   -Transient, and usually mild, facial weakness with facial injections  -Transient, and usually mild, head or neck weakness with head/neck injections  -Reduction or loss of forehead facial animation due to forehead muscle weakness  -Eyelid drooping  -Dry eye  -Pain at the site of injection or bruising at the site of injection  -Double vision  -Potential unknown long term risks  Contraindications: You should not have Botox if you are pregnant, nursing, allergic to albumin, have an infection, skin condition, or muscle weakness at the site of the injection, or have myasthenia gravis, Lambert-Eaton syndrome, or ALS.  It is also possible that as with any injection, there may be an allergic reaction or no effect from the medication. Reduced effectiveness after repeated injections is sometimes seen and rarely infection at the injection site may occur. All care will be taken to  prevent these side effects. If therapy is given over a long time, atrophy and wasting in the muscle injected may occur. Occasionally the patient's become refractory to treatment because they develop antibodies to the toxin. In this event, therapy needs to be modified.  I have read the above information and consent to the administration of botulism toxin.    BOTOX PROCEDURE NOTE FOR MIGRAINE HEADACHE    Contraindications and precautions discussed with patient(above). Aseptic procedure was observed and patient tolerated procedure. Procedure performed by Dr. Artemio Aly  The condition has existed for more than 6 months, and pt does not have a diagnosis of ALS, Myasthenia Gravis or Lambert-Eaton Syndrome.  Risks and benefits of injections discussed and pt agrees to proceed with the procedure.  Written consent obtained  These injections are medically necessary. Pt  receives good benefits from these injections. These injections do not cause sedations or hallucinations which the oral therapies may cause.  Description of procedure:  The patient was placed in a sitting position. The standard protocol was used for Botox as follows, with 5 units of Botox injected at each site:   -Procerus muscle, midline injection  -Corrugator muscle, bilateral injection  -Frontalis muscle, bilateral injection, with 2 sites each side, medial injection was performed in the upper one third of the frontalis muscle, in the region vertical from the medial inferior edge of the superior orbital rim. The lateral injection was again in the upper one third of the forehead vertically above the lateral limbus of the cornea, 1.5 cm lateral to the medial injection site.  -Temporalis muscle injection, 4 sites, bilaterally. The  first injection was 3 cm above the tragus of the ear, second injection site was 1.5 cm to 3 cm up from the first injection site in line with the tragus of the ear. The third injection site was 1.5-3 cm forward  between the first 2 injection sites. The fourth injection site was 1.5 cm posterior to the second injection site.   -Occipitalis muscle injection, 3 sites, bilaterally. The first injection was done one half way between the occipital protuberance and the tip of the mastoid process behind the ear. The second injection site was done lateral and superior to the first, 1 fingerbreadth from the first injection. The third injection site was 1 fingerbreadth superiorly and medially from the first injection site.  -Cervical paraspinal muscle injection, 2 sites, bilateral knee first injection site was 1 cm from the midline of the cervical spine, 3 cm inferior to the lower border of the occipital protuberance. The second injection site was 1.5 cm superiorly and laterally to the first injection site.  -Trapezius muscle injection was performed at 3 sites, bilaterally. The first injection site was in the upper trapezius muscle halfway between the inflection point of the neck, and the acromion. The second injection site was one half way between the acromion and the first injection site. The third injection was done between the first injection site and the inflection point of the neck.   Will return for repeat injection in 3 months.   200 units of Botox was used, 45 Botox not injected was wasted. The patient tolerated the procedure well, there were no complications of the above procedure.

## 2023-04-19 NOTE — Progress Notes (Signed)
Botox- 200 units x 1 vial Lot: [id number] Expiration: 05/2025 NDC: 0023-3921-02  Bacteriostatic 0.9% Sodium Chloride-  4 mL  Lot: [id number] Expiration: 01/24/2024 NDC: 0409-1966-02  Dx: G43.709  B/B Witnessed by Sandy,RN   

## 2023-04-29 ENCOUNTER — Other Ambulatory Visit (HOSPITAL_COMMUNITY): Payer: Self-pay

## 2023-05-30 ENCOUNTER — Other Ambulatory Visit (HOSPITAL_COMMUNITY): Payer: Self-pay

## 2023-05-31 ENCOUNTER — Other Ambulatory Visit (HOSPITAL_COMMUNITY): Payer: Self-pay

## 2023-06-23 ENCOUNTER — Other Ambulatory Visit (HOSPITAL_COMMUNITY): Payer: Self-pay

## 2023-06-23 MED ORDER — GEL-KAM 0.4 % DT GEL
DENTAL | 3 refills | Status: AC
  Filled 2023-06-23: qty 122, 30d supply, fill #0

## 2023-06-25 ENCOUNTER — Encounter: Payer: Self-pay | Admitting: Neurology

## 2023-06-28 ENCOUNTER — Other Ambulatory Visit (HOSPITAL_COMMUNITY): Payer: Self-pay

## 2023-06-28 MED ORDER — NURTEC 75 MG PO TBDP
ORAL_TABLET | ORAL | 5 refills | Status: DC
Start: 2023-06-28 — End: 2023-10-11

## 2023-07-19 ENCOUNTER — Ambulatory Visit: Admitting: Neurology

## 2023-07-19 DIAGNOSIS — G43709 Chronic migraine without aura, not intractable, without status migrainosus: Secondary | ICD-10-CM | POA: Diagnosis not present

## 2023-07-19 MED ORDER — ONABOTULINUMTOXINA 200 UNITS IJ SOLR
155.0000 [IU] | Freq: Once | INTRAMUSCULAR | Status: AC
  Administered 2023-07-19: 155 [IU] via INTRAMUSCULAR

## 2023-07-19 NOTE — Patient Instructions (Addendum)
  Non pharmaceutical treatments for migraines  Cefaly    Nerivio     gammaCore SapphireTM is the first and only FDA cleared non-invasive device to treat and prevent multiple types of headache pain via the vagus nerve. It's small, handheld and portable for quick and easy treatments whenever and wherever needed.     E-Neura - [email or web address]     Fascial release tools      Magnesium doses and other OTC medications you can try: 200 mg mag citrate 1-2x daily 400 mg mag oxide 1-2x daily Mag sulfate 600 mg daily  Other vitamin supplementation that may work includes 1000 international units vitamin D3 daily 500 to 1000 mg of B12 daily Riboflavin 400 mg a day 150 to 300 mg co-Q10 daily   [email or web address]

## 2023-07-19 NOTE — Progress Notes (Signed)
Consent Form Botulism Toxin Injection For Chronic Migraine    July 19, 2023: Patient is stable still doing extremely well. 01/24/2023: +a. See pictures > 70% improval in migraine freq and severity she is thrilled  Reviewed orally with patient, additionally signature is on file:  Botulism toxin has been approved by the Federal drug administration for treatment of chronic migraine. Botulism toxin does not cure chronic migraine and it may not be effective in some patients.  The administration of botulism toxin is accomplished by injecting a small amount of toxin into the muscles of the neck and head. Dosage must be titrated for each individual. Any benefits resulting from botulism toxin tend to wear off after 3 months with a repeat injection required if benefit is to be maintained. Injections are usually done every 3-4 months with maximum effect peak achieved by about 2 or 3 weeks. Botulism toxin is expensive and you should be sure of what costs you will incur resulting from the injection.  The side effects of botulism toxin use for chronic migraine may include:   -Transient, and usually mild, facial weakness with facial injections  -Transient, and usually mild, head or neck weakness with head/neck injections  -Reduction or loss of forehead facial animation due to forehead muscle weakness  -Eyelid drooping  -Dry eye  -Pain at the site of injection or bruising at the site of injection  -Double vision  -Potential unknown long term risks  Contraindications: You should not have Botox if you are pregnant, nursing, allergic to albumin, have an infection, skin condition, or muscle weakness at the site of the injection, or have myasthenia gravis, Lambert-Eaton syndrome, or ALS.  It is also possible that as with any injection, there may be an allergic reaction or no effect from the medication. Reduced effectiveness after repeated injections is sometimes seen and rarely infection at the injection  site may occur. All care will be taken to prevent these side effects. If therapy is given over a long time, atrophy and wasting in the muscle injected may occur. Occasionally the patient's become refractory to treatment because they develop antibodies to the toxin. In this event, therapy needs to be modified.  I have read the above information and consent to the administration of botulism toxin.    BOTOX PROCEDURE NOTE FOR MIGRAINE HEADACHE    Contraindications and precautions discussed with patient(above). Aseptic procedure was observed and patient tolerated procedure. Procedure performed by Dr. Artemio Aly  The condition has existed for more than 6 months, and pt does not have a diagnosis of ALS, Myasthenia Gravis or Lambert-Eaton Syndrome.  Risks and benefits of injections discussed and pt agrees to proceed with the procedure.  Written consent obtained  These injections are medically necessary. Pt  receives good benefits from these injections. These injections do not cause sedations or hallucinations which the oral therapies may cause.  Description of procedure:  The patient was placed in a sitting position. The standard protocol was used for Botox as follows, with 5 units of Botox injected at each site:   -Procerus muscle, midline injection  -Corrugator muscle, bilateral injection  -Frontalis muscle, bilateral injection, with 2 sites each side, medial injection was performed in the upper one third of the frontalis muscle, in the region vertical from the medial inferior edge of the superior orbital rim. The lateral injection was again in the upper one third of the forehead vertically above the lateral limbus of the cornea, 1.5 cm lateral to the medial injection site.  -  Temporalis muscle injection, 4 sites, bilaterally. The first injection was 3 cm above the tragus of the ear, second injection site was 1.5 cm to 3 cm up from the first injection site in line with the tragus of the ear. The  third injection site was 1.5-3 cm forward between the first 2 injection sites. The fourth injection site was 1.5 cm posterior to the second injection site.   -Occipitalis muscle injection, 3 sites, bilaterally. The first injection was done one half way between the occipital protuberance and the tip of the mastoid process behind the ear. The second injection site was done lateral and superior to the first, 1 fingerbreadth from the first injection. The third injection site was 1 fingerbreadth superiorly and medially from the first injection site.  -Cervical paraspinal muscle injection, 2 sites, bilateral knee first injection site was 1 cm from the midline of the cervical spine, 3 cm inferior to the lower border of the occipital protuberance. The second injection site was 1.5 cm superiorly and laterally to the first injection site.  -Trapezius muscle injection was performed at 3 sites, bilaterally. The first injection site was in the upper trapezius muscle halfway between the inflection point of the neck, and the acromion. The second injection site was one half way between the acromion and the first injection site. The third injection was done between the first injection site and the inflection point of the neck.   Will return for repeat injection in 3 months.   200 units of Botox was used, 45 Botox not injected was wasted. The patient tolerated the procedure well, there were no complications of the above procedure.

## 2023-07-19 NOTE — Progress Notes (Signed)
Botox- 200 units x 1 vial Lot: [id number] Expiration: 05/2025 NDC: 7425-9563-87  Bacteriostatic 0.9% Sodium Chloride-  4 mL  Lot: [id number] Expiration: 01/24/2024 NDC: 9518-8416-60  Dx: Y30.160  B/B Witnessed by Delmer Islam

## 2023-07-19 NOTE — Progress Notes (Signed)
Consent Form Botulism Toxin Injection For Chronic Migraine  01/24/2023: +a. See pictures > 70% improval in migraine freq and severity she is thrilled  Reviewed orally with patient, additionally signature is on file:  Botulism toxin has been approved by the Federal drug administration for treatment of chronic migraine. Botulism toxin does not cure chronic migraine and it may not be effective in some patients.  The administration of botulism toxin is accomplished by injecting a small amount of toxin into the muscles of the neck and head. Dosage must be titrated for each individual. Any benefits resulting from botulism toxin tend to wear off after 3 months with a repeat injection required if benefit is to be maintained. Injections are usually done every 3-4 months with maximum effect peak achieved by about 2 or 3 weeks. Botulism toxin is expensive and you should be sure of what costs you will incur resulting from the injection.  The side effects of botulism toxin use for chronic migraine may include:   -Transient, and usually mild, facial weakness with facial injections  -Transient, and usually mild, head or neck weakness with head/neck injections  -Reduction or loss of forehead facial animation due to forehead muscle weakness  -Eyelid drooping  -Dry eye  -Pain at the site of injection or bruising at the site of injection  -Double vision  -Potential unknown long term risks  Contraindications: You should not have Botox if you are pregnant, nursing, allergic to albumin, have an infection, skin condition, or muscle weakness at the site of the injection, or have myasthenia gravis, Lambert-Eaton syndrome, or ALS.  It is also possible that as with any injection, there may be an allergic reaction or no effect from the medication. Reduced effectiveness after repeated injections is sometimes seen and rarely infection at the injection site may occur. All care will be taken to prevent these side effects. If  therapy is given over a long time, atrophy and wasting in the muscle injected may occur. Occasionally the patient's become refractory to treatment because they develop antibodies to the toxin. In this event, therapy needs to be modified.  I have read the above information and consent to the administration of botulism toxin.    BOTOX PROCEDURE NOTE FOR MIGRAINE HEADACHE    Contraindications and precautions discussed with patient(above). Aseptic procedure was observed and patient tolerated procedure. Procedure performed by Dr. Artemio Aly  The condition has existed for more than 6 months, and pt does not have a diagnosis of ALS, Myasthenia Gravis or Lambert-Eaton Syndrome.  Risks and benefits of injections discussed and pt agrees to proceed with the procedure.  Written consent obtained  These injections are medically necessary. Pt  receives good benefits from these injections. These injections do not cause sedations or hallucinations which the oral therapies may cause.  Description of procedure:  The patient was placed in a sitting position. The standard protocol was used for Botox as follows, with 5 units of Botox injected at each site:   -Procerus muscle, midline injection  -Corrugator muscle, bilateral injection  -Frontalis muscle, bilateral injection, with 2 sites each side, medial injection was performed in the upper one third of the frontalis muscle, in the region vertical from the medial inferior edge of the superior orbital rim. The lateral injection was again in the upper one third of the forehead vertically above the lateral limbus of the cornea, 1.5 cm lateral to the medial injection site.  -Temporalis muscle injection, 4 sites, bilaterally. The first injection was 3 cm  above the tragus of the ear, second injection site was 1.5 cm to 3 cm up from the first injection site in line with the tragus of the ear. The third injection site was 1.5-3 cm forward between the first 2 injection  sites. The fourth injection site was 1.5 cm posterior to the second injection site.   -Occipitalis muscle injection, 3 sites, bilaterally. The first injection was done one half way between the occipital protuberance and the tip of the mastoid process behind the ear. The second injection site was done lateral and superior to the first, 1 fingerbreadth from the first injection. The third injection site was 1 fingerbreadth superiorly and medially from the first injection site.  -Cervical paraspinal muscle injection, 2 sites, bilateral knee first injection site was 1 cm from the midline of the cervical spine, 3 cm inferior to the lower border of the occipital protuberance. The second injection site was 1.5 cm superiorly and laterally to the first injection site.  -Trapezius muscle injection was performed at 3 sites, bilaterally. The first injection site was in the upper trapezius muscle halfway between the inflection point of the neck, and the acromion. The second injection site was one half way between the acromion and the first injection site. The third injection was done between the first injection site and the inflection point of the neck.   Will return for repeat injection in 3 months.   200 units of Botox was used, 45 Botox not injected was wasted. The patient tolerated the procedure well, there were no complications of the above procedure.

## 2023-10-11 ENCOUNTER — Ambulatory Visit: Admitting: Neurology

## 2023-10-11 DIAGNOSIS — G43709 Chronic migraine without aura, not intractable, without status migrainosus: Secondary | ICD-10-CM | POA: Diagnosis not present

## 2023-10-11 DIAGNOSIS — G43009 Migraine without aura, not intractable, without status migrainosus: Secondary | ICD-10-CM

## 2023-10-11 MED ORDER — ONABOTULINUMTOXINA 200 UNITS IJ SOLR
155.0000 [IU] | Freq: Once | INTRAMUSCULAR | Status: AC
  Administered 2023-10-11: 155 [IU] via INTRAMUSCULAR

## 2023-10-11 MED ORDER — NURTEC 75 MG PO TBDP
75.0000 mg | ORAL_TABLET | Freq: Every day | ORAL | 11 refills | Status: DC | PRN
  Filled 2023-10-11: qty 16, 30d supply, fill #0

## 2023-10-11 NOTE — Progress Notes (Signed)
Bacterostatic 0.9% Sodium Chloride  LOT #: [id number] NDC: 5784-6962-95 EXP: 2027/03  Botox 200 units x 1 vial  LOT #: [id number] NDC: 4401-0272-53 EXP: 08/25/2024  BB  Witnessed by Mack Hook

## 2023-10-11 NOTE — Progress Notes (Signed)
Consent Form Botulism Toxin Injection For Chronic Migraine    10/11/2023: Patient doing excellent! Now only 4 migraine days a month and < 10 total headache days a month. Failed maxalt, imitrex, prescribe nurtec sent to [facility]  Meds ordered this encounter  Medications   botulinum toxin Type A (BOTOX) injection 155 Units    Bacterostatic 0.9% Sodium Chloride  LOT #: [id number] NDC: 9562-1308-65 EXP: 2027/03  Botox 200 units x 1 vial  LOT #: [id number] NDC: 2952-8413-24 EXP: 08/25/2024  BB   Rimegepant Sulfate (NURTEC) 75 MG TBDP    Sig: Take 1 tablet (75 mg total) by mouth daily as needed. For migraines. Take as close to onset of migraine as possible. One daily maximum. Please use [facility] Card BIN#: [id number]  PCN#: CN GRP#: [id number]  ID#: [id number]    Dispense:  16 tablet    Refill:  11    Please use [facility] Card BIN#: [id number]  PCN#: CN GRP#: [id number]  ID#: [id number]     July 19, 2023: Patient is stable still doing extremely well. 01/24/2023: +a. See pictures > 70% improval in migraine freq and severity she is thrilled  Reviewed orally with patient, additionally signature is on file:  Botulism toxin has been approved by the Federal drug administration for treatment of chronic migraine. Botulism toxin does not cure chronic migraine and it may not be effective in some patients.  The administration of botulism toxin is accomplished by injecting a small amount of toxin into the muscles of the neck and head. Dosage must be titrated for each individual. Any benefits resulting from botulism toxin tend to wear off after 3 months with a repeat injection required if benefit is to be maintained. Injections are usually done every 3-4 months with maximum effect peak achieved by about 2 or 3 weeks. Botulism toxin is expensive and you should be sure of what costs you will incur resulting from the injection.  The side effects of botulism toxin use for chronic migraine may include:   -Transient,  and usually mild, facial weakness with facial injections  -Transient, and usually mild, head or neck weakness with head/neck injections  -Reduction or loss of forehead facial animation due to forehead muscle weakness  -Eyelid drooping  -Dry eye  -Pain at the site of injection or bruising at the site of injection  -Double vision  -Potential unknown long term risks  Contraindications: You should not have Botox if you are pregnant, nursing, allergic to albumin, have an infection, skin condition, or muscle weakness at the site of the injection, or have myasthenia gravis, Lambert-Eaton syndrome, or ALS.  It is also possible that as with any injection, there may be an allergic reaction or no effect from the medication. Reduced effectiveness after repeated injections is sometimes seen and rarely infection at the injection site may occur. All care will be taken to prevent these side effects. If therapy is given over a long time, atrophy and wasting in the muscle injected may occur. Occasionally the patient's become refractory to treatment because they develop antibodies to the toxin. In this event, therapy needs to be modified.  I have read the above information and consent to the administration of botulism toxin.    BOTOX PROCEDURE NOTE FOR MIGRAINE HEADACHE    Contraindications and precautions discussed with patient(above). Aseptic procedure was observed and patient tolerated procedure. Procedure performed by Dr. Artemio Aly  The condition has existed for more than 6 months, and pt does not have a diagnosis of ALS,  Myasthenia Gravis or Lambert-Eaton Syndrome.  Risks and benefits of injections discussed and pt agrees to proceed with the procedure.  Written consent obtained  These injections are medically necessary. Pt  receives good benefits from these injections. These injections do not cause sedations or hallucinations which the oral therapies may cause.  Description of procedure:  The patient  was placed in a sitting position. The standard protocol was used for Botox as follows, with 5 units of Botox injected at each site:   -Procerus muscle, midline injection  -Corrugator muscle, bilateral injection  -Frontalis muscle, bilateral injection, with 2 sites each side, medial injection was performed in the upper one third of the frontalis muscle, in the region vertical from the medial inferior edge of the superior orbital rim. The lateral injection was again in the upper one third of the forehead vertically above the lateral limbus of the cornea, 1.5 cm lateral to the medial injection site.  -Temporalis muscle injection, 4 sites, bilaterally. The first injection was 3 cm above the tragus of the ear, second injection site was 1.5 cm to 3 cm up from the first injection site in line with the tragus of the ear. The third injection site was 1.5-3 cm forward between the first 2 injection sites. The fourth injection site was 1.5 cm posterior to the second injection site.   -Occipitalis muscle injection, 3 sites, bilaterally. The first injection was done one half way between the occipital protuberance and the tip of the mastoid process behind the ear. The second injection site was done lateral and superior to the first, 1 fingerbreadth from the first injection. The third injection site was 1 fingerbreadth superiorly and medially from the first injection site.  -Cervical paraspinal muscle injection, 2 sites, bilateral knee first injection site was 1 cm from the midline of the cervical spine, 3 cm inferior to the lower border of the occipital protuberance. The second injection site was 1.5 cm superiorly and laterally to the first injection site.  -Trapezius muscle injection was performed at 3 sites, bilaterally. The first injection site was in the upper trapezius muscle halfway between the inflection point of the neck, and the acromion. The second injection site was one half way between the acromion and  the first injection site. The third injection was done between the first injection site and the inflection point of the neck.   Will return for repeat injection in 3 months.   200 units of Botox was used, 45 Botox not injected was wasted. The patient tolerated the procedure well, there were no complications of the above procedure.

## 2023-10-11 NOTE — Patient Instructions (Addendum)
Needs nurtec and PA - sent to [facility]

## 2023-10-12 ENCOUNTER — Other Ambulatory Visit (HOSPITAL_COMMUNITY): Payer: Self-pay

## 2023-10-20 ENCOUNTER — Other Ambulatory Visit (HOSPITAL_COMMUNITY): Payer: Self-pay

## 2023-10-25 ENCOUNTER — Other Ambulatory Visit: Payer: Self-pay

## 2023-10-30 ENCOUNTER — Encounter: Payer: Self-pay | Admitting: Neurology

## 2023-10-31 ENCOUNTER — Telehealth: Payer: Self-pay | Admitting: *Deleted

## 2023-10-31 NOTE — Telephone Encounter (Signed)
 Pt states she can't fill Nurtec again unless PA done.

## 2023-11-01 ENCOUNTER — Other Ambulatory Visit (HOSPITAL_COMMUNITY): Payer: Self-pay

## 2023-11-01 ENCOUNTER — Telehealth: Payer: Self-pay

## 2023-11-01 NOTE — Telephone Encounter (Signed)
 Pharmacy Patient Advocate Encounter   Received notification from Physician's Office that prior authorization for Nurtec is required/requested.   Insurance verification completed.   The patient is insured through [employer] .   Per test claim: PA required; PA submitted to above mentioned insurance via Fax Key/confirmation #/EOC [id number] Status is pending  Faxed PA form along with clinicals to [phone]

## 2023-11-01 NOTE — Telephone Encounter (Signed)
 PA request has been Submitted. New Encounter created for follow up. For additional info see Pharmacy Prior Auth telephone encounter from 11/01/2023.

## 2023-11-04 ENCOUNTER — Other Ambulatory Visit (HOSPITAL_COMMUNITY): Payer: Self-pay

## 2023-11-04 NOTE — Telephone Encounter (Signed)
 Patient has been notified of approval

## 2023-11-04 NOTE — Telephone Encounter (Signed)
 Pharmacy Patient Advocate Encounter  Received notification from [employer] that Prior Authorization for Nurtec has been APPROVED from 11/02/2023 to 10/24/2098. Unable to obtain price due to refill too soon rejection, last fill date 10/30/2023 next available fill date1/14/2025   PA #/Case ID/Reference #: [id number]

## 2023-11-08 ENCOUNTER — Other Ambulatory Visit: Payer: Self-pay | Admitting: Neurology

## 2023-11-08 ENCOUNTER — Encounter: Payer: Self-pay | Admitting: Neurology

## 2023-11-08 DIAGNOSIS — G43009 Migraine without aura, not intractable, without status migrainosus: Secondary | ICD-10-CM

## 2023-11-08 MED ORDER — NURTEC 75 MG PO TBDP
75.0000 mg | ORAL_TABLET | Freq: Every day | ORAL | 11 refills | Status: DC | PRN
  Filled 2023-11-08: qty 16, 28d supply, fill #0

## 2023-11-09 ENCOUNTER — Other Ambulatory Visit (HOSPITAL_COMMUNITY): Payer: Self-pay

## 2023-11-17 ENCOUNTER — Other Ambulatory Visit (HOSPITAL_COMMUNITY): Payer: Self-pay

## 2023-11-18 ENCOUNTER — Other Ambulatory Visit (HOSPITAL_COMMUNITY): Payer: Self-pay

## 2023-11-18 ENCOUNTER — Other Ambulatory Visit: Payer: Self-pay

## 2023-11-18 MED ORDER — OSELTAMIVIR PHOSPHATE 75 MG PO CAPS
75.0000 mg | ORAL_CAPSULE | Freq: Every day | ORAL | 0 refills | Status: DC
  Filled 2023-11-18 (×2): qty 10, 10d supply, fill #0

## 2023-12-09 ENCOUNTER — Other Ambulatory Visit (HOSPITAL_COMMUNITY): Payer: Self-pay

## 2023-12-09 MED ORDER — CIPROFLOXACIN HCL 500 MG PO TABS
500.0000 mg | ORAL_TABLET | Freq: Two times a day (BID) | ORAL | 0 refills | Status: DC
  Filled 2023-12-09: qty 14, 7d supply, fill #0

## 2024-01-05 ENCOUNTER — Ambulatory Visit (INDEPENDENT_AMBULATORY_CARE_PROVIDER_SITE_OTHER): Admitting: Neurology

## 2024-01-05 DIAGNOSIS — G43709 Chronic migraine without aura, not intractable, without status migrainosus: Secondary | ICD-10-CM | POA: Diagnosis not present

## 2024-01-05 DIAGNOSIS — G43009 Migraine without aura, not intractable, without status migrainosus: Secondary | ICD-10-CM

## 2024-01-05 MED ORDER — ONABOTULINUMTOXINA 200 UNITS IJ SOLR
155.0000 [IU] | Freq: Once | INTRAMUSCULAR | Status: AC
  Administered 2024-01-05: 155 [IU] via INTRAMUSCULAR

## 2024-01-05 NOTE — Progress Notes (Signed)
 Botox- 200 units x 1 vial Lot: [id number] Expiration: 01/2026 NDC: 1478-2956-21  Bacteriostatic 0.9% Sodium Chloride- * mL  Lot: [id number] Expiration: 08/25/2024 NDC: 8469-6295-28  Dx: U13.244 B/B Witnessed by  Alverda Skeans, RN

## 2024-01-05 NOTE — Progress Notes (Signed)
 Consent Form Botulism Toxin Injection For Chronic Migraine   01/05/2024: Patient doing excellent! Now only 4-5 migraine days a month and < 10 total headache days a month. Failed maxalt, imitrex, prescribe nurtec sent to [facility]  Meds ordered this encounter  Medications   botulinum toxin Type A (BOTOX) injection 155 Units   Rimegepant Sulfate (NURTEC) 75 MG TBDP    Sig: Take 1 tablet (75 mg total) by mouth daily as needed. For migraines. Take as close to onset of migraine as possible. One daily maximum. PLEASE USE COPAY CARD:BIN [id number] PCN CN GRP [id number] ID [id number] EXP 10/24/2024    Dispense:  16 tablet    Refill:  11    PLEASE USE COPAY CARD:BIN [id number] PCN CN GRP [id number] ID [id number] EXP 10/24/2024     July 19, 2023: Patient is stable still doing extremely well. 01/24/2023: +a. See pictures > 70% improval in migraine freq and severity she is thrilled  Reviewed orally with patient, additionally signature is on file:  Botulism toxin has been approved by the Federal drug administration for treatment of chronic migraine. Botulism toxin does not cure chronic migraine and it may not be effective in some patients.  The administration of botulism toxin is accomplished by injecting a small amount of toxin into the muscles of the neck and head. Dosage must be titrated for each individual. Any benefits resulting from botulism toxin tend to wear off after 3 months with a repeat injection required if benefit is to be maintained. Injections are usually done every 3-4 months with maximum effect peak achieved by about 2 or 3 weeks. Botulism toxin is expensive and you should be sure of what costs you will incur resulting from the injection.  The side effects of botulism toxin use for chronic migraine may include:   -Transient, and usually mild, facial weakness with facial injections  -Transient, and usually mild, head or neck weakness with head/neck injections  -Reduction or loss of  forehead facial animation due to forehead muscle weakness  -Eyelid drooping  -Dry eye  -Pain at the site of injection or bruising at the site of injection  -Double vision  -Potential unknown long term risks  Contraindications: You should not have Botox if you are pregnant, nursing, allergic to albumin, have an infection, skin condition, or muscle weakness at the site of the injection, or have myasthenia gravis, Lambert-Eaton syndrome, or ALS.  It is also possible that as with any injection, there may be an allergic reaction or no effect from the medication. Reduced effectiveness after repeated injections is sometimes seen and rarely infection at the injection site may occur. All care will be taken to prevent these side effects. If therapy is given over a long time, atrophy and wasting in the muscle injected may occur. Occasionally the patient's become refractory to treatment because they develop antibodies to the toxin. In this event, therapy needs to be modified.  I have read the above information and consent to the administration of botulism toxin.    BOTOX PROCEDURE NOTE FOR MIGRAINE HEADACHE    Contraindications and precautions discussed with patient(above). Aseptic procedure was observed and patient tolerated procedure. Procedure performed by Dr. Artemio Aly  The condition has existed for more than 6 months, and pt does not have a diagnosis of ALS, Myasthenia Gravis or Lambert-Eaton Syndrome.  Risks and benefits of injections discussed and pt agrees to proceed with the procedure.  Written consent obtained  These injections are medically necessary. Pt  receives good benefits  from these injections. These injections do not cause sedations or hallucinations which the oral therapies may cause.  Description of procedure:  The patient was placed in a sitting position. The standard protocol was used for Botox as follows, with 5 units of Botox injected at each site:   -Procerus muscle,  midline injection  -Corrugator muscle, bilateral injection  -Frontalis muscle, bilateral injection, with 2 sites each side, medial injection was performed in the upper one third of the frontalis muscle, in the region vertical from the medial inferior edge of the superior orbital rim. The lateral injection was again in the upper one third of the forehead vertically above the lateral limbus of the cornea, 1.5 cm lateral to the medial injection site.  -Temporalis muscle injection, 4 sites, bilaterally. The first injection was 3 cm above the tragus of the ear, second injection site was 1.5 cm to 3 cm up from the first injection site in line with the tragus of the ear. The third injection site was 1.5-3 cm forward between the first 2 injection sites. The fourth injection site was 1.5 cm posterior to the second injection site.   -Occipitalis muscle injection, 3 sites, bilaterally. The first injection was done one half way between the occipital protuberance and the tip of the mastoid process behind the ear. The second injection site was done lateral and superior to the first, 1 fingerbreadth from the first injection. The third injection site was 1 fingerbreadth superiorly and medially from the first injection site.  -Cervical paraspinal muscle injection, 2 sites, bilateral knee first injection site was 1 cm from the midline of the cervical spine, 3 cm inferior to the lower border of the occipital protuberance. The second injection site was 1.5 cm superiorly and laterally to the first injection site.  -Trapezius muscle injection was performed at 3 sites, bilaterally. The first injection site was in the upper trapezius muscle halfway between the inflection point of the neck, and the acromion. The second injection site was one half way between the acromion and the first injection site. The third injection was done between the first injection site and the inflection point of the neck.   Will return for repeat  injection in 3 months.   200 units of Botox was used, 45 Botox not injected was wasted. The patient tolerated the procedure well, there were no complications of the above procedure.

## 2024-01-09 ENCOUNTER — Other Ambulatory Visit (HOSPITAL_COMMUNITY): Payer: Self-pay

## 2024-01-09 MED ORDER — NURTEC 75 MG PO TBDP
75.0000 mg | ORAL_TABLET | Freq: Every day | ORAL | 11 refills | Status: DC | PRN
  Filled 2024-01-09: qty 16, 16d supply, fill #0

## 2024-01-27 ENCOUNTER — Other Ambulatory Visit: Payer: Self-pay | Admitting: Family Medicine

## 2024-01-27 DIAGNOSIS — Z1231 Encounter for screening mammogram for malignant neoplasm of breast: Secondary | ICD-10-CM

## 2024-03-01 ENCOUNTER — Encounter (HOSPITAL_COMMUNITY): Payer: Self-pay

## 2024-03-08 ENCOUNTER — Ambulatory Visit
Admission: RE | Admit: 2024-03-08 | Discharge: 2024-03-08 | Disposition: A | Discharge status: Home / Self Care | Source: Ambulatory Visit | Attending: Family Medicine | Admitting: Family Medicine

## 2024-03-08 DIAGNOSIS — Z1231 Encounter for screening mammogram for malignant neoplasm of breast: Secondary | ICD-10-CM

## 2024-03-15 ENCOUNTER — Telehealth: Payer: Self-pay | Admitting: Neurology

## 2024-03-15 NOTE — Telephone Encounter (Signed)
 Submitted auth renewal request to [employer] via [employer], status is pending.

## 2024-03-28 NOTE — Telephone Encounter (Signed)
 Auth: [id number] (03/09/24-03/09/25)

## 2024-04-12 ENCOUNTER — Ambulatory Visit: Admitting: Neurology

## 2024-04-12 VITALS — BP 139/86 | HR 85

## 2024-04-12 DIAGNOSIS — G43709 Chronic migraine without aura, not intractable, without status migrainosus: Secondary | ICD-10-CM | POA: Diagnosis not present

## 2024-04-12 MED ORDER — ONABOTULINUMTOXINA 200 UNITS IJ SOLR
155.0000 [IU] | Freq: Once | INTRAMUSCULAR | Status: AC
  Administered 2024-04-12: 155 [IU] via INTRAMUSCULAR

## 2024-04-12 NOTE — Progress Notes (Signed)
 Botox - 200 units x 1 vial Lot: [id number] Expiration: 07/2026 NDC: 4540-9811-91   Bacteriostatic 0.9% Sodium Chloride- 4mL  Lot: [id number] Expiration: 04/23/2025 NDC: 6213-0865-78   Dx: I69.629 B/B Witnessed by Santana Cue, CMA

## 2024-04-12 NOTE — Progress Notes (Signed)
 Consent Form Botulism Toxin Injection For Chronic Migraine   04/12/2024: stable, still doing great, nurtec effective 01/05/2024: Patient doing excellent! Now only 4-5 migraine days a month and < 10 total headache days a month. Failed maxalt, imitrex, prescribe nurtec sent to [facility]  Meds ordered this encounter  Medications   botulinum toxin Type A  (BOTOX ) injection 155 Units    Botox - 200 units x 1 vial Lot: [id number] Expiration: 07/2026 NDC: 2595-6387-56   Bacteriostatic 0.9% Sodium Chloride- 4mL  Lot: [id number] Expiration: 04/23/2025 NDC: 1884-1660-63   Dx: K16.010 B/B Witnessed by Santana Cue, CMA     July 19, 2023: Patient is stable still doing extremely well. 01/24/2023: +a. See pictures > 70% improval in migraine freq and severity she is thrilled  Reviewed orally with patient, additionally signature is on file:  Botulism toxin has been approved by the Federal drug administration for treatment of chronic migraine. Botulism toxin does not cure chronic migraine and it may not be effective in some patients.  The administration of botulism toxin is accomplished by injecting a small amount of toxin into the muscles of the neck and head. Dosage must be titrated for each individual. Any benefits resulting from botulism toxin tend to wear off after 3 months with a repeat injection required if benefit is to be maintained. Injections are usually done every 3-4 months with maximum effect peak achieved by about 2 or 3 weeks. Botulism toxin is expensive and you should be sure of what costs you will incur resulting from the injection.  The side effects of botulism toxin use for chronic migraine may include:   -Transient, and usually mild, facial weakness with facial injections  -Transient, and usually mild, head or neck weakness with head/neck injections  -Reduction or loss of forehead facial animation due to forehead muscle weakness  -Eyelid drooping  -Dry eye  -Pain at the site of  injection or bruising at the site of injection  -Double vision  -Potential unknown long term risks  Contraindications: You should not have Botox  if you are pregnant, nursing, allergic to albumin, have an infection, skin condition, or muscle weakness at the site of the injection, or have myasthenia gravis, Lambert-Eaton syndrome, or ALS.  It is also possible that as with any injection, there may be an allergic reaction or no effect from the medication. Reduced effectiveness after repeated injections is sometimes seen and rarely infection at the injection site may occur. All care will be taken to prevent these side effects. If therapy is given over a long time, atrophy and wasting in the muscle injected may occur. Occasionally the patient's become refractory to treatment because they develop antibodies to the toxin. In this event, therapy needs to be modified.  I have read the above information and consent to the administration of botulism toxin.    BOTOX  PROCEDURE NOTE FOR MIGRAINE HEADACHE    Contraindications and precautions discussed with patient(above). Aseptic procedure was observed and patient tolerated procedure. Procedure performed by Dr. Criselda Dolly  The condition has existed for more than 6 months, and pt does not have a diagnosis of ALS, Myasthenia Gravis or Lambert-Eaton Syndrome.  Risks and benefits of injections discussed and pt agrees to proceed with the procedure.  Written consent obtained  These injections are medically necessary. Pt  receives good benefits from these injections. These injections do not cause sedations or hallucinations which the oral therapies may cause.  Description of procedure:  The patient was placed in a sitting position. The standard protocol  was used for Botox  as follows, with 5 units of Botox  injected at each site:   -Procerus muscle, midline injection  -Corrugator muscle, bilateral injection  -Frontalis muscle, bilateral injection, with 2 sites  each side, medial injection was performed in the upper one third of the frontalis muscle, in the region vertical from the medial inferior edge of the superior orbital rim. The lateral injection was again in the upper one third of the forehead vertically above the lateral limbus of the cornea, 1.5 cm lateral to the medial injection site.  -Temporalis muscle injection, 4 sites, bilaterally. The first injection was 3 cm above the tragus of the ear, second injection site was 1.5 cm to 3 cm up from the first injection site in line with the tragus of the ear. The third injection site was 1.5-3 cm forward between the first 2 injection sites. The fourth injection site was 1.5 cm posterior to the second injection site.   -Occipitalis muscle injection, 3 sites, bilaterally. The first injection was done one half way between the occipital protuberance and the tip of the mastoid process behind the ear. The second injection site was done lateral and superior to the first, 1 fingerbreadth from the first injection. The third injection site was 1 fingerbreadth superiorly and medially from the first injection site.  -Cervical paraspinal muscle injection, 2 sites, bilateral knee first injection site was 1 cm from the midline of the cervical spine, 3 cm inferior to the lower border of the occipital protuberance. The second injection site was 1.5 cm superiorly and laterally to the first injection site.  -Trapezius muscle injection was performed at 3 sites, bilaterally. The first injection site was in the upper trapezius muscle halfway between the inflection point of the neck, and the acromion. The second injection site was one half way between the acromion and the first injection site. The third injection was done between the first injection site and the inflection point of the neck.   Will return for repeat injection in 3 months.   200 units of Botox  was used, 45 Botox  not injected was wasted. The patient tolerated the  procedure well, there were no complications of the above procedure.

## 2024-04-26 ENCOUNTER — Other Ambulatory Visit (HOSPITAL_COMMUNITY): Payer: Self-pay

## 2024-04-26 MED ORDER — AMOXICILLIN 500 MG PO CAPS
ORAL_CAPSULE | ORAL | 1 refills | Status: AC
  Filled 2024-04-26: qty 30, 11d supply, fill #0

## 2024-05-02 ENCOUNTER — Other Ambulatory Visit (HOSPITAL_COMMUNITY): Payer: Self-pay

## 2024-05-02 MED ORDER — AMOXICILLIN 500 MG PO CAPS
500.0000 mg | ORAL_CAPSULE | Freq: Three times a day (TID) | ORAL | 0 refills | Status: DC
  Filled 2024-05-02 – 2024-05-03 (×2): qty 21, 7d supply, fill #0

## 2024-05-03 ENCOUNTER — Other Ambulatory Visit (HOSPITAL_COMMUNITY): Payer: Self-pay

## 2024-05-15 ENCOUNTER — Other Ambulatory Visit (HOSPITAL_COMMUNITY): Payer: Self-pay

## 2024-06-27 ENCOUNTER — Other Ambulatory Visit: Payer: Self-pay | Admitting: Obstetrics and Gynecology

## 2024-06-27 DIAGNOSIS — R92333 Mammographic heterogeneous density, bilateral breasts: Secondary | ICD-10-CM

## 2024-07-09 ENCOUNTER — Ambulatory Visit: Admitting: Neurology

## 2024-07-09 ENCOUNTER — Telehealth: Payer: Self-pay | Admitting: Neurology

## 2024-07-09 NOTE — Telephone Encounter (Signed)
 LVM and sent mychart msg informing pt of appt cancellation for 9/15 - MD out

## 2024-07-13 ENCOUNTER — Ambulatory Visit
Admission: RE | Admit: 2024-07-13 | Discharge: 2024-07-13 | Disposition: A | Discharge status: Home / Self Care | Payer: Self-pay | Source: Ambulatory Visit | Attending: Obstetrics and Gynecology | Admitting: Obstetrics and Gynecology

## 2024-07-13 DIAGNOSIS — R92333 Mammographic heterogeneous density, bilateral breasts: Secondary | ICD-10-CM

## 2024-07-13 MED ORDER — GADOPICLENOL 0.5 MMOL/ML IV SOLN
7.5000 mL | Freq: Once | INTRAVENOUS | Status: AC | PRN
Start: 2024-07-13 — End: 2024-07-13
  Administered 2024-07-13: 7.5 mL via INTRAVENOUS

## 2024-07-16 ENCOUNTER — Other Ambulatory Visit (HOSPITAL_COMMUNITY): Payer: Self-pay

## 2024-07-16 MED ORDER — AMOXICILLIN 500 MG PO CAPS
500.0000 mg | ORAL_CAPSULE | Freq: Three times a day (TID) | ORAL | 0 refills | Status: AC
  Filled 2024-07-16: qty 21, 7d supply, fill #0

## 2024-07-16 MED ORDER — DEXAMETHASONE 2 MG PO TABS
ORAL_TABLET | ORAL | 0 refills | Status: AC
  Filled 2024-07-16: qty 10, 5d supply, fill #0

## 2024-07-24 ENCOUNTER — Other Ambulatory Visit: Payer: Self-pay | Admitting: Obstetrics and Gynecology

## 2024-07-24 DIAGNOSIS — N63 Unspecified lump in unspecified breast: Secondary | ICD-10-CM

## 2024-07-25 NOTE — Telephone Encounter (Signed)
 LVM informing pt of Dr. Sharion departure, requested a cb from pt if she would like to r/s or be referred elsewhere.

## 2024-07-26 ENCOUNTER — Encounter

## 2024-07-26 ENCOUNTER — Other Ambulatory Visit

## 2024-08-06 ENCOUNTER — Other Ambulatory Visit (HOSPITAL_COMMUNITY): Payer: Self-pay

## 2024-08-06 MED ORDER — NITROFURANTOIN MONOHYD MACRO 100 MG PO CAPS
100.0000 mg | ORAL_CAPSULE | Freq: Every day | ORAL | 3 refills | Status: AC
  Filled 2024-08-06: qty 30, 30d supply, fill #0

## 2024-08-06 MED ORDER — CIPROFLOXACIN HCL 500 MG PO TABS
500.0000 mg | ORAL_TABLET | Freq: Two times a day (BID) | ORAL | 1 refills | Status: AC
  Filled 2024-08-06: qty 6, 3d supply, fill #0

## 2024-08-06 NOTE — Telephone Encounter (Signed)
 LVM on 10/1 and again today asking pt to call back if she'd like to r/s with another provider.

## 2024-09-06 ENCOUNTER — Other Ambulatory Visit: Payer: Self-pay

## 2024-09-06 DIAGNOSIS — G43009 Migraine without aura, not intractable, without status migrainosus: Secondary | ICD-10-CM

## 2024-09-10 ENCOUNTER — Ambulatory Visit: Admitting: Neurology

## 2024-09-17 ENCOUNTER — Telehealth: Payer: Self-pay | Admitting: Neurology

## 2024-09-17 DIAGNOSIS — G43009 Migraine without aura, not intractable, without status migrainosus: Secondary | ICD-10-CM

## 2024-09-17 MED ORDER — NURTEC 75 MG PO TBDP: 75.0000 mg | ORAL_TABLET | Freq: Every day | ORAL | 3 refills | Status: AC | PRN

## 2024-09-17 NOTE — Telephone Encounter (Signed)
 thanks

## 2024-09-17 NOTE — Telephone Encounter (Signed)
 Pt has called to r/s her Botox  and to request a refill on her Rimegepant Sulfate  (NURTEC) 75 MG TBDP  to [facility]

## 2024-09-17 NOTE — Telephone Encounter (Signed)
 Prescribed Nurtec as needed, 30 pills to last 90 days with refills, [facility].

## 2024-09-25 ENCOUNTER — Other Ambulatory Visit (HOSPITAL_COMMUNITY): Payer: Self-pay

## 2024-09-25 ENCOUNTER — Telehealth: Payer: Self-pay

## 2024-09-25 MED ORDER — AMOXICILLIN 500 MG PO CAPS
500.0000 mg | ORAL_CAPSULE | Freq: Three times a day (TID) | ORAL | 0 refills | Status: AC
  Filled 2024-09-25: qty 21, 7d supply, fill #0

## 2024-09-25 NOTE — Telephone Encounter (Signed)
 I am not familiar with this patient.  POD 4: Can you reach out to her and see if she has tried and failed Ubrelvy?  If not, if she is willing to try it, please send order for Ubrelvy 100 mg strength 1 pill as needed to her pharmacy, #10 with 3 refills.

## 2024-09-25 NOTE — Telephone Encounter (Signed)
 Hello Prescriber!  We are in the process of submitting a prior authorization for your patient for Nurtec. We are reaching out for clinical guidance for the following information to complete the request: The patient's plan requires documentation showing that patient to has tried and failed, or have contraindication to the following agents before they will approve our request: Ubrelvy. Or please advise if you would like to switch prescribed medication per insurance requirement.   Thank you! Pharmacy Team

## 2024-09-26 ENCOUNTER — Telehealth: Payer: Self-pay | Admitting: *Deleted

## 2024-09-26 ENCOUNTER — Other Ambulatory Visit (HOSPITAL_COMMUNITY): Payer: Self-pay

## 2024-09-26 NOTE — Telephone Encounter (Signed)
 Pharmacy Patient Advocate Encounter  Received notification from [employer] that Prior Authorization for Nurtec has been APPROVED from 09/26/2024 to 03/25/2025   PA #/Case ID/Reference #: [id number]

## 2024-09-26 NOTE — Telephone Encounter (Signed)
 Pt said PA is needed for Nurtec. Would like done asap. Thank you!

## 2024-09-28 ENCOUNTER — Telehealth: Payer: Self-pay | Admitting: Neurology

## 2024-09-28 DIAGNOSIS — G43709 Chronic migraine without aura, not intractable, without status migrainosus: Secondary | ICD-10-CM

## 2024-09-28 NOTE — Telephone Encounter (Signed)
 Submitted request via [facility] Portal for [facility] to be added to PA so they can fill Botox .

## 2024-10-02 MED ORDER — ONABOTULINUMTOXINA 200 UNITS IJ SOLR: INTRAMUSCULAR | 3 refills | Status: AC

## 2024-10-02 NOTE — Addendum Note (Signed)
 Addended by: Yates Weisgerber on: 10/02/2024 04:04 PM   Modules accepted: Orders

## 2024-10-02 NOTE — Telephone Encounter (Signed)
 Rx for Botox  200 units signed.

## 2024-10-02 NOTE — Telephone Encounter (Signed)
 Auth was approved, please send rx to [facility].  Auth#: [id number] (03/09/24-03/09/25)

## 2024-10-02 NOTE — Addendum Note (Signed)
 Addended by: HILLIARD HEATHER CROME on: 10/02/2024 03:38 PM   Modules accepted: Orders

## 2024-10-30 ENCOUNTER — Ambulatory Visit: Admitting: Neurology

## 2024-10-30 DIAGNOSIS — G43709 Chronic migraine without aura, not intractable, without status migrainosus: Secondary | ICD-10-CM

## 2024-10-30 MED ORDER — ONABOTULINUMTOXINA 200 UNITS IJ SOLR
155.0000 [IU] | Freq: Once | INTRAMUSCULAR | Status: AC
  Administered 2024-10-30: 155 [IU] via INTRAMUSCULAR

## 2024-10-30 NOTE — Progress Notes (Signed)
 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  This is her first visit with me.  She previously followed with my colleague Dr. Onetha [name] and had her last injection on 04/12/2024, at which time she received a standard total dose of 155 units. She has been on Nurtec as needed for breakthrough migraines. I reviewed prior clinic notes from Dr. Epp and copied the notes below for reference. Procedure consent was signed for today's procedure. The patient has previously had a detailed conversation with Dr. Epp about expectations, limitations, benefits as well as potential adverse effects of botulinum toxin injections. The patient understands that the side effects include (but are not limited to): Mouth dryness, dryness of eyes, speech and swallowing difficulties, respiratory depression or problems breathing, weakness of muscles including more distant muscles than the ones injected, flu-like symptoms, myalgias, injection site reactions such as redness, itching, swelling, pain, and infection.   Today, 10/30/24: She reports still doing well with Botox  injections but is overdue at this point with increase in breakthrough migraines noted.  She took ibuprofen 600 mg about 3 days ago.  Nurtec also continues to be effective for breakthrough migraines.  She tries to hydrate well with water and limits her caffeine to about 1 serving per day.  She may not always get enough sleep, triggers may include menstrual cycle as well.  She has had no side effects from the Botox .   200 units of botulinum toxin type A  were reconstituted using preservative-free normal saline to a concentration of 10 units per 0.1 mL and drawn up into 1 mL tuberculin syringes. Lot number and exp date as below, specialty pharmacy.     O/E: Doing well today, no acute distress.     The patient was situated in a chair,  sitting comfortably. After preparing the areas with 70% isopropyl alcohol and using a 26 gauge 1 1/2 inch hollow lumen recording EMG needle for the neck injections as well as a 30 gauge 1 inch needle for the facial injections, a total dose of 155 units of botulinum toxin type A  in the form of Botox  was injected into the muscles and the following distribution and quantities:   #1: 10 units on the right and 10 units in the left frontalis muscles, broken down in 2 sites on each side. #2: 5 units in the right and 5 units in the left corrugator muscles. #3: 15 units in the right and 15 units in the left occipitalis muscles, broken down in 3 sites on each side. #4: 20 units in the right and 20 units in the left temporalis muscles, broken down in 4 sites on each side.  #5: 15 units on the right and 15 units in the left upper trapezius muscles, broken down in 3 sites on each side. #6: 10 units in the right and 10 units in the left splenius capitis muscles, broken down in 2 sites on each side.  #7: 2.5 units in the right and 2.5 units in the left procerus muscles.   EMG guidance was utilized for the neck injections and upper back muscle injections, with minimal activity noted in the right upper trap area and right splenius capitis areas.      Botox - 200 units x 1 vial Lot: [id number] Expiration: 12/2026  NDC: 9976-6078-97   Bacteriostatic 0.9% Sodium Chloride- 2.2  mL  Lot: [id number] Expiration: 12/2025 NDC: 9590-8033-97  Dx: G16.709  S/P  Witnessed by Alicia [name],CMA   A dose of 45 units out of a total dose of 200 units was discarded as unavoidable waste.     The patient tolerated the procedure well without immediate complications. She was advised to make a followup appointment for repeat injections in 3 months from now and encouraged to call us  with any interim questions, concerns, problems, or updates. She was in agreement and did not have any questions prior to leaving clinic  today.    Previously:   04/12/2024 (Dr. Ines): <<stable, still doing great, nurtec effective>>  01/05/2024 (Dr. Ines): <<Patient doing excellent! Now only 4-5 migraine days a month and < 10 total headache days a month. Failed maxalt, imitrex, prescribe nurtec sent to [facility]>>  10/11/2023 (AA): <<Patient doing excellent! Now only 4 migraine days a month and < 10 total headache days a month. Failed maxalt, imitrex, prescribe nurtec sent to [facility]>>  July 19, 2023 (AA): <<Patient is stable still doing extremely well.>>  01/24/2023 (AA): <<+a. See pictures > 70% improval in migraine freq and severity she is thrilled>>

## 2024-11-15 ENCOUNTER — Other Ambulatory Visit (HOSPITAL_COMMUNITY): Payer: Self-pay

## 2024-11-15 MED ORDER — OSELTAMIVIR PHOSPHATE 75 MG PO CAPS
75.0000 mg | ORAL_CAPSULE | Freq: Every day | ORAL | 0 refills | Status: AC
  Filled 2024-11-15: qty 10, 10d supply, fill #0

## 2025-01-21 ENCOUNTER — Ambulatory Visit: Admitting: Neurology

## 2025-01-31 ENCOUNTER — Ambulatory Visit: Admitting: Adult Health
